# Patient Record
Sex: Female | Born: 1990 | Race: White | Hispanic: No | Smoking: Never smoker
Health system: Southern US, Community
[De-identification: ages and names within clinical notes are randomized; demographics above are authoritative.]

## PROBLEM LIST (undated history)

## (undated) DIAGNOSIS — Z9889 Other specified postprocedural states: Secondary | ICD-10-CM

## (undated) DIAGNOSIS — K219 Gastro-esophageal reflux disease without esophagitis: Secondary | ICD-10-CM

## (undated) DIAGNOSIS — R112 Nausea with vomiting, unspecified: Secondary | ICD-10-CM

## (undated) DIAGNOSIS — G43909 Migraine, unspecified, not intractable, without status migrainosus: Secondary | ICD-10-CM

## (undated) DIAGNOSIS — E119 Type 2 diabetes mellitus without complications: Secondary | ICD-10-CM

## (undated) DIAGNOSIS — O24419 Gestational diabetes mellitus in pregnancy, unspecified control: Secondary | ICD-10-CM

## (undated) HISTORY — PX: NO PAST SURGERIES: SHX2092

## (undated) HISTORY — PX: OTHER SURGICAL HISTORY: SHX169

## (undated) HISTORY — PX: LIPOSUCTION: SHX10

---
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
## 2010-03-04 NOTE — L&D Delivery Note (Signed)
Operative Delivery Note At 6:15 PM a viable female was delivered via .  Presentation: vertex; Position: Left,, Occiput,, Anterior; Station: +3.  Verbal consent: obtained from patient.  Risks and benefits discussed in detail.  Risks include, but are not limited to the risks of anesthesia, bleeding, infection, damage to maternal tissues, fetal cephalhematoma.  There is also the risk of inability to effect vaginal delivery of the head, or shoulder dystocia that cannot be resolved by established maneuvers, leading to the need for emergency cesarean section.  APGAR: , ; weight .   Placenta status: , .   Cord:  with the following complications: .  Cord pH: pending  Anesthesia: Epidural  Instruments: kiwi vac Episiotomy: none Lacerations: none Suture Repair: none Est. Blood Loss350 (mL):   Mom to postpartum.  Baby to nursery-stable.  Zerita Boers 12/02/2010, 6:25 PM

## 2010-04-11 LAB — RPR: RPR: NONREACTIVE

## 2010-04-11 LAB — ANTIBODY SCREEN: Antibody Screen: NEGATIVE

## 2010-04-11 LAB — HEPATITIS B SURFACE ANTIGEN: Hepatitis B Surface Ag: NEGATIVE

## 2010-06-11 LAB — URINE CULTURE: Colony Count: NO GROWTH

## 2010-06-11 LAB — URINALYSIS, ROUTINE W REFLEX MICROSCOPIC
Nitrite: NEGATIVE
Specific Gravity, Urine: 1.027 (ref 1.005–1.030)
pH: 6.5 (ref 5.0–8.0)

## 2010-06-11 LAB — URINE MICROSCOPIC-ADD ON

## 2010-06-11 LAB — PREGNANCY, URINE: Preg Test, Ur: NEGATIVE

## 2010-06-14 LAB — PREGNANCY, URINE: Preg Test, Ur: NEGATIVE

## 2010-06-14 LAB — URINALYSIS, ROUTINE W REFLEX MICROSCOPIC
Ketones, ur: NEGATIVE mg/dL
Nitrite: NEGATIVE
Specific Gravity, Urine: 1.016 (ref 1.005–1.030)
pH: 6.5 (ref 5.0–8.0)

## 2010-06-14 LAB — URINE MICROSCOPIC-ADD ON

## 2010-11-22 LAB — CBC
HCT: 28 — ABNORMAL LOW
Hemoglobin: 9.2 — ABNORMAL LOW
MCHC: 32.7
RDW: 17.1 — ABNORMAL HIGH

## 2010-12-02 ENCOUNTER — Inpatient Hospital Stay (HOSPITAL_COMMUNITY): Payer: Medicaid Other | Admitting: Anesthesiology

## 2010-12-02 ENCOUNTER — Encounter (HOSPITAL_COMMUNITY): Payer: Self-pay | Admitting: *Deleted

## 2010-12-02 ENCOUNTER — Inpatient Hospital Stay (HOSPITAL_COMMUNITY)
Admission: AD | Admit: 2010-12-02 | Discharge: 2010-12-04 | DRG: 775 | Disposition: A | Discharge status: Home / Self Care | Payer: Medicaid Other | Source: Ambulatory Visit | Attending: Obstetrics & Gynecology | Admitting: Obstetrics & Gynecology

## 2010-12-02 ENCOUNTER — Encounter (HOSPITAL_COMMUNITY): Payer: Self-pay | Admitting: Anesthesiology

## 2010-12-02 ENCOUNTER — Encounter (HOSPITAL_COMMUNITY): Payer: Self-pay

## 2010-12-02 DIAGNOSIS — Z22330 Carrier of Group B streptococcus: Secondary | ICD-10-CM

## 2010-12-02 DIAGNOSIS — O99892 Other specified diseases and conditions complicating childbirth: Secondary | ICD-10-CM | POA: Diagnosis present

## 2010-12-02 LAB — CBC
Platelets: 217 10*3/uL (ref 150–400)
RBC: 4.65 MIL/uL (ref 3.87–5.11)
WBC: 10.3 10*3/uL (ref 4.0–10.5)

## 2010-12-02 LAB — RPR: RPR Ser Ql: NONREACTIVE

## 2010-12-02 MED ORDER — ACETAMINOPHEN 325 MG PO TABS: 650.0000 mg | ORAL_TABLET | ORAL | Status: DC | PRN

## 2010-12-02 MED ORDER — SENNOSIDES-DOCUSATE SODIUM 8.6-50 MG PO TABS
2.0000 | ORAL_TABLET | Freq: Every day | ORAL | Status: DC
  Administered 2010-12-02 – 2010-12-03 (×2): 2 via ORAL

## 2010-12-02 MED ORDER — PENICILLIN G POTASSIUM 5000000 UNITS IJ SOLR: 5.0000 10*6.[IU] | Freq: Once | INTRAVENOUS | Status: DC

## 2010-12-02 MED ORDER — LACTATED RINGERS IV SOLN: INTRAVENOUS | Status: DC

## 2010-12-02 MED ORDER — OXYTOCIN BOLUS FROM INFUSION: 500.0000 mL | Freq: Once | INTRAVENOUS | Status: DC

## 2010-12-02 MED ORDER — CITRIC ACID-SODIUM CITRATE 334-500 MG/5ML PO SOLN: 30.0000 mL | ORAL | Status: DC | PRN

## 2010-12-02 MED ORDER — OXYTOCIN 20 UNITS IN LACTATED RINGERS INFUSION - SIMPLE: 125.0000 mL/h | Freq: Once | INTRAVENOUS | Status: DC

## 2010-12-02 MED ORDER — DEXTROSE 5 % IV SOLN: 2.5000 10*6.[IU] | INTRAVENOUS | Status: DC

## 2010-12-02 MED ORDER — OXYCODONE-ACETAMINOPHEN 5-325 MG PO TABS: 2.0000 | ORAL_TABLET | ORAL | Status: DC | PRN

## 2010-12-02 MED ORDER — LANOLIN HYDROUS EX OINT: TOPICAL_OINTMENT | CUTANEOUS | Status: DC | PRN

## 2010-12-02 MED ORDER — EPHEDRINE 5 MG/ML INJ
10.0000 mg | INTRAVENOUS | Status: DC | PRN
  Filled 2010-12-02: qty 4

## 2010-12-02 MED ORDER — OXYTOCIN BOLUS FROM INFUSION
500.0000 mL | Freq: Once | INTRAVENOUS | Status: DC
  Filled 2010-12-02: qty 500
  Filled 2010-12-02: qty 1000

## 2010-12-02 MED ORDER — LACTATED RINGERS IV SOLN: 500.0000 mL | INTRAVENOUS | Status: DC | PRN

## 2010-12-02 MED ORDER — IBUPROFEN 600 MG PO TABS: 600.0000 mg | ORAL_TABLET | Freq: Four times a day (QID) | ORAL | Status: DC | PRN

## 2010-12-02 MED ORDER — FENTANYL 2.5 MCG/ML BUPIVACAINE 1/10 % EPIDURAL INFUSION (WH - ANES)
INTRAMUSCULAR | Status: DC | PRN
  Administered 2010-12-02: 14 mL/h via EPIDURAL

## 2010-12-02 MED ORDER — DIBUCAINE 1 % RE OINT: 1.0000 "application " | TOPICAL_OINTMENT | RECTAL | Status: DC | PRN

## 2010-12-02 MED ORDER — IBUPROFEN 600 MG PO TABS
600.0000 mg | ORAL_TABLET | Freq: Four times a day (QID) | ORAL | Status: DC
  Administered 2010-12-02 – 2010-12-04 (×6): 600 mg via ORAL
  Filled 2010-12-02 (×7): qty 1

## 2010-12-02 MED ORDER — TETANUS-DIPHTH-ACELL PERTUSSIS 5-2.5-18.5 LF-MCG/0.5 IM SUSP: 0.5000 mL | Freq: Once | INTRAMUSCULAR | Status: DC

## 2010-12-02 MED ORDER — ONDANSETRON HCL 4 MG/2ML IJ SOLN: 4.0000 mg | Freq: Four times a day (QID) | INTRAMUSCULAR | Status: DC | PRN

## 2010-12-02 MED ORDER — BENZOCAINE-MENTHOL 20-0.5 % EX AERO
INHALATION_SPRAY | CUTANEOUS | Status: AC
  Administered 2010-12-02: 1 via TOPICAL
  Filled 2010-12-02: qty 56

## 2010-12-02 MED ORDER — EPHEDRINE 5 MG/ML INJ
10.0000 mg | INTRAVENOUS | Status: DC | PRN
  Filled 2010-12-02 (×2): qty 4

## 2010-12-02 MED ORDER — LACTATED RINGERS IV SOLN
INTRAVENOUS | Status: DC
  Administered 2010-12-02 (×2): via INTRAVENOUS

## 2010-12-02 MED ORDER — FENTANYL 2.5 MCG/ML BUPIVACAINE 1/10 % EPIDURAL INFUSION (WH - ANES)
14.0000 mL/h | INTRAMUSCULAR | Status: DC
  Administered 2010-12-02: 14 mL/h via EPIDURAL
  Filled 2010-12-02 (×2): qty 60

## 2010-12-02 MED ORDER — ONDANSETRON HCL 4 MG/2ML IJ SOLN: 4.0000 mg | INTRAMUSCULAR | Status: DC | PRN

## 2010-12-02 MED ORDER — LIDOCAINE HCL (PF) 1 % IJ SOLN: 30.0000 mL | INTRAMUSCULAR | Status: DC | PRN

## 2010-12-02 MED ORDER — PHENYLEPHRINE 40 MCG/ML (10ML) SYRINGE FOR IV PUSH (FOR BLOOD PRESSURE SUPPORT)
80.0000 ug | PREFILLED_SYRINGE | INTRAVENOUS | Status: DC | PRN
  Filled 2010-12-02 (×2): qty 5

## 2010-12-02 MED ORDER — SODIUM CHLORIDE 0.9 % IV SOLN
2.0000 g | Freq: Four times a day (QID) | INTRAVENOUS | Status: DC
  Administered 2010-12-02 (×2): 2 g via INTRAVENOUS
  Filled 2010-12-02 (×4): qty 2000

## 2010-12-02 MED ORDER — WITCH HAZEL-GLYCERIN EX PADS: 1.0000 "application " | MEDICATED_PAD | CUTANEOUS | Status: DC | PRN

## 2010-12-02 MED ORDER — PRENATAL PLUS 27-1 MG PO TABS
1.0000 | ORAL_TABLET | Freq: Every day | ORAL | Status: DC
  Administered 2010-12-03: 1 via ORAL
  Filled 2010-12-02: qty 1

## 2010-12-02 MED ORDER — LIDOCAINE HCL 1.5 % IJ SOLN
INTRAMUSCULAR | Status: DC | PRN
  Administered 2010-12-02 (×2): 5 mL via EPIDURAL

## 2010-12-02 MED ORDER — DIPHENHYDRAMINE HCL 25 MG PO CAPS: 25.0000 mg | ORAL_CAPSULE | Freq: Four times a day (QID) | ORAL | Status: DC | PRN

## 2010-12-02 MED ORDER — PHENYLEPHRINE 40 MCG/ML (10ML) SYRINGE FOR IV PUSH (FOR BLOOD PRESSURE SUPPORT)
80.0000 ug | PREFILLED_SYRINGE | INTRAVENOUS | Status: DC | PRN
  Filled 2010-12-02: qty 5

## 2010-12-02 MED ORDER — BENZOCAINE-MENTHOL 20-0.5 % EX AERO
1.0000 "application " | INHALATION_SPRAY | CUTANEOUS | Status: DC | PRN
  Administered 2010-12-02: 1 via TOPICAL

## 2010-12-02 MED ORDER — ONDANSETRON HCL 4 MG PO TABS: 4.0000 mg | ORAL_TABLET | ORAL | Status: DC | PRN

## 2010-12-02 MED ORDER — OXYCODONE-ACETAMINOPHEN 5-325 MG PO TABS: 1.0000 | ORAL_TABLET | ORAL | Status: DC | PRN

## 2010-12-02 MED ORDER — DIPHENHYDRAMINE HCL 50 MG/ML IJ SOLN: 12.5000 mg | INTRAMUSCULAR | Status: DC | PRN

## 2010-12-02 MED ORDER — LIDOCAINE HCL (PF) 1 % IJ SOLN
30.0000 mL | INTRAMUSCULAR | Status: DC | PRN
  Filled 2010-12-02: qty 30

## 2010-12-02 MED ORDER — FLEET ENEMA 7-19 GM/118ML RE ENEM: 1.0000 | ENEMA | RECTAL | Status: DC | PRN

## 2010-12-02 MED ORDER — LACTATED RINGERS IV SOLN: 500.0000 mL | Freq: Once | INTRAVENOUS | Status: DC

## 2010-12-02 MED ORDER — ZOLPIDEM TARTRATE 5 MG PO TABS: 5.0000 mg | ORAL_TABLET | Freq: Every evening | ORAL | Status: DC | PRN

## 2010-12-02 MED ORDER — SIMETHICONE 80 MG PO CHEW: 80.0000 mg | CHEWABLE_TABLET | ORAL | Status: DC | PRN

## 2010-12-02 NOTE — Anesthesia Postprocedure Evaluation (Signed)
Anesthesia Post Note  Patient: Vickie Lloyd  Procedure(s) Performed: * No procedures listed *  Anesthesia type: Epidural  Patient location: Mother/Baby  Post pain: Pain level controlled  Post assessment: Post-op Vital signs reviewed  Last Vitals:  Filed Vitals:   12/02/10 1846  BP: 127/76  Pulse: 102  Temp:   Resp: 20    Post vital signs: Reviewed  Level of consciousness: awake  Complications: No apparent anesthesia complications

## 2010-12-02 NOTE — H&P (Signed)
Kalianne Fetting is a 20 y.o. female presenting for active labor. Maternal Medical History:  Reason for admission: Reason for admission: contractions.  Contractions: Onset was 13-24 hours ago.   Frequency: regular.   Perceived severity is moderate.    Fetal activity: Perceived fetal activity is normal.   Last perceived fetal movement was within the past hour.      OB History    Grav Para Term Preterm Abortions TAB SAB Ect Mult Living   2 1 1  0 0 0 0 0 0 1     Past Medical History  Diagnosis Date  . No pertinent past medical history    Past Surgical History  Procedure Date  . No past surgeries    Family History: family history is not on file. Social History:  reports that she has never smoked. She does not have any smokeless tobacco history on file. She reports that she does not drink alcohol or use illicit drugs.  Review of Systems  Constitutional: Negative.   HENT: Negative.   Eyes: Negative.   Respiratory: Negative.   Cardiovascular: Negative.   Gastrointestinal: Negative.   Genitourinary: Negative.   Musculoskeletal: Negative.   Skin: Negative.   Neurological: Negative.   Endo/Heme/Allergies: Negative.   Psychiatric/Behavioral: Negative.     Dilation: 6.5 Effacement (%): 80 Station: -1 Exam by:: d. Tommye Lehenbauer,CNM Blood pressure 118/78, pulse 91, temperature 97.9 F (36.6 C), temperature source Oral, resp. rate 16, height 5\' 4"  (1.626 m), weight 65.772 kg (145 lb). Maternal Exam:  Uterine Assessment: Contraction strength is moderate.  Contraction frequency is regular.   Abdomen: Patient reports no abdominal tenderness. Fetal presentation: vertex  Introitus: Normal vulva. Normal vagina.    Physical Exam  Constitutional: She is oriented to person, place, and time. She appears well-developed and well-nourished.  HENT:  Head: Normocephalic.  Neck: Normal range of motion.  Cardiovascular: Normal rate, regular rhythm and normal heart sounds.   Respiratory: Effort  normal and breath sounds normal.  GI: Soft. Bowel sounds are normal.  Genitourinary: Vagina normal and uterus normal.  Musculoskeletal: Normal range of motion.  Neurological: She is alert and oriented to person, place, and time. She has normal reflexes.  Skin: Skin is warm and dry.  Psychiatric: She has a normal mood and affect. Her behavior is normal. Judgment and thought content normal.    Prenatal labs: ABO, Rh: A/Positive/-- (02/08 0000) Antibody: Negative (02/08 0000) Rubella: Immune (02/08 0000) RPR: Nonreactive (02/08 0000)  HBsAg: Negative (02/08 0000)  HIV:    GBS: Positive (09/14 0000)   Assessment/Plan: Admit antisipate vag delivery.   Zerita Boers 12/02/2010, 1:51 PM

## 2010-12-02 NOTE — Anesthesia Preprocedure Evaluation (Signed)
Anesthesia Evaluation  Name, MR# and DOB Patient awake  General Assessment Comment  Reviewed: Allergy & Precautions, H&P , Patient's Chart, lab work & pertinent test results  Airway Mallampati: I TM Distance: >3 FB Neck ROM: full    Dental No notable dental hx.    Pulmonary  clear to auscultation  Pulmonary exam normal       Cardiovascular     Neuro/Psych Negative Neurological ROS  Negative Psych ROS   GI/Hepatic negative GI ROS Neg liver ROS    Endo/Other  Negative Endocrine ROS  Renal/GU negative Renal ROS     Musculoskeletal negative musculoskeletal ROS (+)   Abdominal Normal abdominal exam  (+)   Peds  Hematology negative hematology ROS (+)   Anesthesia Other Findings   Reproductive/Obstetrics (+) Pregnancy                           Anesthesia Physical Anesthesia Plan  ASA: II  Anesthesia Plan: Epidural   Post-op Pain Management:    Induction:   Airway Management Planned:   Additional Equipment:   Intra-op Plan:   Post-operative Plan:   Informed Consent: I have reviewed the patients History and Physical, chart, labs and discussed the procedure including the risks, benefits and alternatives for the proposed anesthesia with the patient or authorized representative who has indicated his/her understanding and acceptance.     Plan Discussed with:   Anesthesia Plan Comments:         Anesthesia Quick Evaluation

## 2010-12-02 NOTE — Anesthesia Procedure Notes (Signed)
Epidural Patient location during procedure: OB Start time: 12/02/2010 2:16 PM End time: 12/02/2010 2:23 PM Reason for block: procedure for pain  Staffing Anesthesiologist: Sandrea Hughs Performed by: anesthesiologist   Preanesthetic Checklist Completed: patient identified, site marked, surgical consent, pre-op evaluation, timeout performed, IV checked, risks and benefits discussed and monitors and equipment checked  Epidural Patient position: sitting Prep: site prepped and draped and DuraPrep Patient monitoring: continuous pulse ox and blood pressure Approach: midline Injection technique: LOR air  Needle:  Needle type: Tuohy  Needle gauge: 17 G Needle length: 9 cm Needle insertion depth: 5 cm cm Catheter type: closed end flexible Catheter size: 19 Gauge Catheter at skin depth: 10 cm Test dose: negative and 1.5% lidocaine  Assessment Sensory level: T8 Events: blood not aspirated, injection not painful, no injection resistance, negative IV test and no paresthesia

## 2010-12-02 NOTE — Progress Notes (Signed)
Contractions since around 10:00 strong and every 10 minutes now 3 minutes, no vaginal bleeding, G2P1 40 weeks

## 2010-12-02 NOTE — Progress Notes (Signed)
Pt presents to MAU with complaints of contractions. Pt states contractions started at 10:00 am

## 2010-12-02 NOTE — H&P (Signed)
Agree with above note.  Daryle Amis H. 12/02/2010 2:53 PM

## 2010-12-03 NOTE — Addendum Note (Signed)
Addendum  created 12/03/10 0810 by Suella Grove   Modules edited:Charges VN, Notes Section

## 2010-12-03 NOTE — Progress Notes (Signed)
Post Partum Day 1 Subjective: no complaints, up ad lib, voiding and tolerating PO  Objective: Blood pressure 96/58, pulse 88, temperature 98.4 F (36.9 C), temperature source Oral, resp. rate 18, height 5\' 4"  (1.626 m), weight 145 lb (65.772 kg), SpO2 99.00%, unknown if currently breastfeeding.  Physical Exam:  General: alert, cooperative and no distress Lochia: appropriate Uterine Fundus: firm Incision: NA DVT Evaluation: No evidence of DVT seen on physical exam. Negative Homan's sign.   Basename 12/02/10 1315  HGB 10.2*  HCT 33.0*    Assessment/Plan: Plan for discharge tomorrow, Circumcision prior to discharge and Contraception Implanon   LOS: 1 day   Mat Carne 12/03/2010, 7:35 AM

## 2010-12-03 NOTE — Anesthesia Postprocedure Evaluation (Signed)
  Anesthesia Post-op Note  Patient: Vickie Lloyd  Procedure(s) Performed: * No procedures listed *  Patient Location: Mother/Baby  Anesthesia Type: Epidural  Level of Consciousness: awake, alert , oriented and patient cooperative  Airway and Oxygen Therapy: Patient Spontanous Breathing  Post-op Pain: none  Post-op Assessment: Post-op Vital signs reviewed, Patient's Cardiovascular Status Stable, Respiratory Function Stable, No signs of Nausea or vomiting, Adequate PO intake and Pain level controlled  Post-op Vital Signs: Reviewed and stable  Complications: No apparent anesthesia complications

## 2010-12-03 NOTE — Progress Notes (Signed)
UR chart review completed.  

## 2010-12-04 MED ORDER — IBUPROFEN 600 MG PO TABS: 600.0000 mg | ORAL_TABLET | Freq: Four times a day (QID) | ORAL | Status: AC

## 2010-12-04 NOTE — Discharge Summary (Signed)
Obstetric Discharge Summary Reason for Admission: onset of labor Prenatal Procedures:  None Intrapartum Procedures: spontaneous vaginal delivery Postpartum Procedures: none Complications-Operative and Postpartum: none Hemoglobin  Date Value Range Status  12/02/2010 10.2* 12.0-15.0 (g/dL) Final     HCT  Date Value Range Status  12/02/2010 33.0* 36.0-46.0 (%) Final    Discharge Diagnoses: Term Pregnancy-delivered  Discharge Information: Date: 12/04/2010 Activity: pelvic rest Diet: routine Medications: Ibuprofen Condition: stable Instructions: refer to practice specific booklet Discharge to: home Follow-up Information    Follow up with [facility]. Make an appointment in 6 weeks. (Post-partum visit)          Newborn Data: Live born female  Birth Weight: 7 lb 9 oz (3430 g) APGAR: 9, 9  Home with mother.  SMITH,JOSHUA 12/04/2010, 7:37 AM

## 2010-12-04 NOTE — Progress Notes (Signed)
Post Partum Day 2 Subjective: no complaints, voiding, tolerating PO, + flatus and no BM yet  Objective: Blood pressure 97/62, pulse 72, temperature 97.5 F (36.4 C), temperature source Oral, resp. rate 18, height 5\' 4"  (1.626 m), weight 65.772 kg (145 lb), SpO2 100.00%, unknown if currently breastfeeding.  Physical Exam:  General: alert, cooperative and no distress Lochia: appropriate Uterine Fundus: firm DVT Evaluation: No evidence of DVT seen on physical exam. Negative Homan's sign. No cords or calf tenderness. No significant calf/ankle edema.   Basename 12/02/10 1315  HGB 10.2*  HCT 33.0*    Assessment/Plan: Discharge home   LOS: 2 days   Mertis Mosher 12/04/2010, 6:44 AM

## 2010-12-05 NOTE — Discharge Summary (Signed)
Agree with above note.  Vickie Lloyd H. 12/05/2010 9:44 AM

## 2010-12-08 NOTE — Progress Notes (Signed)
I agree with the above. Cam Hai 8:32 PM 12/08/2010

## 2010-12-13 LAB — URINALYSIS, ROUTINE W REFLEX MICROSCOPIC
Glucose, UA: NEGATIVE
Protein, ur: NEGATIVE
Specific Gravity, Urine: 1.02
Urobilinogen, UA: 0.2

## 2010-12-17 LAB — URINALYSIS, ROUTINE W REFLEX MICROSCOPIC
Ketones, ur: NEGATIVE
Nitrite: NEGATIVE
Protein, ur: NEGATIVE
pH: 7

## 2012-05-07 ENCOUNTER — Encounter (HOSPITAL_BASED_OUTPATIENT_CLINIC_OR_DEPARTMENT_OTHER): Payer: Self-pay

## 2012-05-07 ENCOUNTER — Emergency Department (HOSPITAL_BASED_OUTPATIENT_CLINIC_OR_DEPARTMENT_OTHER): Payer: Self-pay

## 2012-05-07 ENCOUNTER — Emergency Department (HOSPITAL_BASED_OUTPATIENT_CLINIC_OR_DEPARTMENT_OTHER)
Admission: EM | Admit: 2012-05-07 | Discharge: 2012-05-07 | Disposition: A | Discharge status: Home / Self Care | Payer: Self-pay | Attending: Emergency Medicine | Admitting: Emergency Medicine

## 2012-05-07 DIAGNOSIS — Z87442 Personal history of urinary calculi: Secondary | ICD-10-CM | POA: Insufficient documentation

## 2012-05-07 DIAGNOSIS — B9789 Other viral agents as the cause of diseases classified elsewhere: Secondary | ICD-10-CM | POA: Insufficient documentation

## 2012-05-07 DIAGNOSIS — Z3202 Encounter for pregnancy test, result negative: Secondary | ICD-10-CM | POA: Insufficient documentation

## 2012-05-07 DIAGNOSIS — B349 Viral infection, unspecified: Secondary | ICD-10-CM

## 2012-05-07 DIAGNOSIS — M549 Dorsalgia, unspecified: Secondary | ICD-10-CM

## 2012-05-07 DIAGNOSIS — M545 Low back pain, unspecified: Secondary | ICD-10-CM | POA: Insufficient documentation

## 2012-05-07 LAB — URINALYSIS, ROUTINE W REFLEX MICROSCOPIC
Bilirubin Urine: NEGATIVE
Hgb urine dipstick: NEGATIVE
Protein, ur: NEGATIVE mg/dL
Urobilinogen, UA: 0.2 mg/dL (ref 0.0–1.0)

## 2012-05-07 MED ORDER — METHOCARBAMOL 500 MG PO TABS: 500.0000 mg | ORAL_TABLET | Freq: Two times a day (BID) | ORAL | Status: DC

## 2012-05-07 MED ORDER — IBUPROFEN 800 MG PO TABS: 800.0000 mg | ORAL_TABLET | Freq: Three times a day (TID) | ORAL | Status: DC

## 2012-05-07 NOTE — ED Provider Notes (Signed)
History     CSN: [id number]  Arrival date & time 05/07/12  1501   First MD Initiated Contact with Patient 05/07/12 1525      Chief Complaint  Patient presents with  . Sore Throat  . Back Pain    (Consider location/radiation/quality/duration/timing/severity/associated sxs/prior treatment) Patient is a 22 y.o. female presenting with pharyngitis and back pain. The history is provided by the patient. No language interpreter was used.  Sore Throat This is a new problem. The current episode started today. The problem occurs constantly. The problem has been unchanged. Associated symptoms include a sore throat. Nothing aggravates the symptoms. The treatment provided moderate relief.  Back Pain Pt complains of low back pain.   Pt reports she has similar pain to when she had a kidney stone  Past Medical History  Diagnosis Date  . No pertinent past medical history     Past Surgical History  Procedure Laterality Date  . No past surgeries      No family history on file.  History  Substance Use Topics  . Smoking status: Never Smoker   . Smokeless tobacco: Not on file  . Alcohol Use: No    OB History   Grav Para Term Preterm Abortions TAB SAB Ect Mult Living   2 2 2  0 0 0 0 0 0 2      Review of Systems  HENT: Positive for sore throat.   Musculoskeletal: Positive for back pain.  All other systems reviewed and are negative.    Allergies  Review of patient's allergies indicates no known allergies.  Home Medications  No current outpatient prescriptions on file.  BP 103/65  Pulse 65  Temp(Src) 97.1 F (36.2 C)  Resp 16  Ht 5\' 3"  (1.6 m)  Wt 135 lb (61.236 kg)  BMI 23.92 kg/m2  SpO2 100%  LMP 04/23/2012  Physical Exam  Nursing note and vitals reviewed. Constitutional: She is oriented to person, place, and time. She appears well-developed and well-nourished.  HENT:  Head: Normocephalic.  Right Ear: External ear normal.  Left Ear: External ear normal.   Mouth/Throat: No oropharyngeal exudate.  Erythema throat  Eyes: Conjunctivae and EOM are normal. Pupils are equal, round, and reactive to light.  Neck: Normal range of motion. Neck supple.  Pulmonary/Chest: Effort normal.  Abdominal: Soft.  Musculoskeletal: Normal range of motion.  Neurological: She is alert and oriented to person, place, and time. She has normal reflexes.  Skin: Skin is warm.  Psychiatric: She has a normal mood and affect.    ED Course  Procedures (including critical care time)  Labs Reviewed  RAPID STREP SCREEN  URINALYSIS, ROUTINE W REFLEX MICROSCOPIC  PREGNANCY, URINE   Ct Abdomen Pelvis Wo Contrast  05/07/2012  *RADIOLOGY REPORT*  Clinical Data: Left flank pain and hematuria.  CT ABDOMEN AND PELVIS WITHOUT CONTRAST  Technique:  Multidetector CT imaging of the abdomen and pelvis was performed following the standard protocol without intravenous contrast.  Comparison: No priors.  Findings:  Lung Bases: Unremarkable.  Abdomen/Pelvis:  There are no abnormal calcifications within the collecting system of either kidney, along the course of either ureter, or within the lumen of the urinary bladder.  No hydroureteronephrosis or perinephric stranding to suggest urinary tract obstruction at this time.  The unenhanced appearance of the liver, gallbladder, pancreas, spleen and bilateral adrenal glands is unremarkable.  The uterus and ovaries are unremarkable in appearance on this noncontrast CT examination.  No significant volume of ascites.  No pneumoperitoneum.  No pathologic distension of small bowel.  No definite pathologic lymphadenopathy identified within the abdomen or pelvis on today's noncontrast CT examination.  The urinary bladder is unremarkable in appearance.  The appendix is normal.  Musculoskeletal: There are no aggressive appearing lytic or blastic lesions noted in the visualized portions of the skeleton.  IMPRESSION: 1.  No acute findings in the abdomen or pelvis to  account for the patient's symptoms.  Specifically, no abnormal urinary tract calculi. 2.  Normal appendix.   Original Report Authenticated By: Trudie Reed, M.D.      No diagnosis found.    MDM  No stone,    Strep negative.   Pt given rx for ibuprofen for discomfort and robaxin.           Lonia Skinner [place], PA-C 05/07/12 1753

## 2012-05-07 NOTE — ED Notes (Signed)
D/c home- advised of prescriptions available in pharmacy

## 2012-05-07 NOTE — ED Notes (Signed)
C/o sore throat x 3 days and back pain x 2 days

## 2012-05-07 NOTE — ED Provider Notes (Signed)
Medical screening examination/treatment/procedure(s) were performed by non-physician practitioner and as supervising physician I was immediately available for consultation/collaboration.  Gilda Crease, MD 05/07/12 1949

## 2014-01-03 ENCOUNTER — Encounter (HOSPITAL_BASED_OUTPATIENT_CLINIC_OR_DEPARTMENT_OTHER): Payer: Self-pay

## 2014-01-26 ENCOUNTER — Encounter: Payer: Medicaid Other | Admitting: Women's Health

## 2014-02-08 ENCOUNTER — Ambulatory Visit (INDEPENDENT_AMBULATORY_CARE_PROVIDER_SITE_OTHER): Payer: Medicaid Other | Admitting: Women's Health

## 2014-02-08 ENCOUNTER — Encounter: Payer: Self-pay | Admitting: Women's Health

## 2014-02-08 VITALS — BP 102/62 | Ht 64.0 in | Wt 140.0 lb

## 2014-02-08 DIAGNOSIS — Z3049 Encounter for surveillance of other contraceptives: Secondary | ICD-10-CM

## 2014-02-08 DIAGNOSIS — Z3202 Encounter for pregnancy test, result negative: Secondary | ICD-10-CM

## 2014-02-08 DIAGNOSIS — Z30017 Encounter for initial prescription of implantable subdermal contraceptive: Secondary | ICD-10-CM

## 2014-02-08 DIAGNOSIS — Z975 Presence of (intrauterine) contraceptive device: Secondary | ICD-10-CM

## 2014-02-08 DIAGNOSIS — Z3046 Encounter for surveillance of implantable subdermal contraceptive: Secondary | ICD-10-CM

## 2014-02-08 LAB — POCT URINE PREGNANCY: PREG TEST UR: NEGATIVE

## 2014-02-08 NOTE — Progress Notes (Signed)
Patient ID: Vickie Lloyd, female   DOB: 1990/10/04, 23 y.o.   MRN: [id number] Vickie Lloyd is a 23 y.o. year old [id number] Caucasian female here for Nexplanon removal and reinsertion.  She was given informed consent for removal and reinsertion of her Nexplanon. Her Nexplanon was placed Oct 2012, No LMP recorded. Patient has had an implant., and her pregnancy test today was neg.   Risks/benefits/side effects of Nexplanon have been discussed and her questions have been answered.  Specifically, a failure rate of 03/998 has been reported, with an increased failure rate if pt takes St. John's Wort and/or antiseizure medicaitons.  Vickie Lloyd is aware of the common side effect of irregular bleeding, which the incidence of decreases over time.  BP 102/62 mmHg  Ht 5\' 4"  (1.626 m)  Wt 140 lb (63.504 kg)  BMI 24.02 kg/m2 No LMP recorded. Patient has had an implant. Results for orders placed or performed in visit on 02/08/14 (from the past 24 hour(s))  POCT urine pregnancy   Collection Time: 02/08/14  3:58 PM  Result Value Ref Range   Preg Test, Ur Negative      Appropriate time out taken. Nexplanon site identified.  Area prepped in usual sterile fashon. Two cc's of 2% lidocaine was used to anesthetize the area. A small stab incision was made right beside the implant on the distal portion.  The Nexplanon rod was grasped using hemostats and removed intact without difficulty.  The area was cleansed again with betadine and the Nexplanon was inserted per manufacturer's recommendations without difficulty.  Steri-strips and a pressure bandage was applied.  There was less than 3 cc blood loss. There were no complications.  The patient tolerated the procedure well.  She was instructed to keep the area clean and dry, remove pressure bandage in 24 hours, and keep insertion site covered with the steri-strips for 3-5 days.  She was given a card indicating date Nexplanon was inserted and date it needs to be removed.    Follow-up PRN problems.  Marge DuncansBooker, Nailyn Dearinger Randall CNM, [facility] ServicesWHNP-BC 02/08/2014 4:21 PM

## 2014-02-08 NOTE — Patient Instructions (Signed)
Keep the area clean and dry.  You can remove the big bandage in 24 hours, and the small steri-strip bandage in 3-5 days.  A back up method, such as condoms, should be used for two weeks. You may have irregular vaginal bleeding for the first 6 months after the Nexplanon is placed, then the bleeding usually lightens and it is possible that you may not have any periods.  If you have any concerns, please give us a call.    Etonogestrel implant What is this medicine? ETONOGESTREL (et oh noe JES trel) is a contraceptive (birth control) device. It is used to prevent pregnancy. It can be used for up to 3 years. This medicine may be used for other purposes; ask your health care provider or pharmacist if you have questions. COMMON BRAND NAME(S): Implanon, Nexplanon What should I tell my health care provider before I take this medicine? They need to know if you have any of these conditions: -abnormal vaginal bleeding -blood vessel disease or blood clots -cancer of the breast, cervix, or liver -depression -diabetes -gallbladder disease -headaches -heart disease or recent heart attack -high blood pressure -high cholesterol -kidney disease -liver disease -renal disease -seizures -tobacco smoker -an unusual or allergic reaction to etonogestrel, other hormones, anesthetics or antiseptics, medicines, foods, dyes, or preservatives -pregnant or trying to get pregnant -breast-feeding How should I use this medicine? This device is inserted just under the skin on the inner side of your upper arm by a health care professional. Talk to your pediatrician regarding the use of this medicine in children. Special care may be needed. Overdosage: If you think you've taken too much of this medicine contact a poison control center or emergency room at once. Overdosage: If you think you have taken too much of this medicine contact a poison control center or emergency room at once. NOTE: This medicine is only for you.  Do not share this medicine with others. What if I miss a dose? This does not apply. What may interact with this medicine? Do not take this medicine with any of the following medications: -amprenavir -bosentan -fosamprenavir This medicine may also interact with the following medications: -barbiturate medicines for inducing sleep or treating seizures -certain medicines for fungal infections like ketoconazole and itraconazole -griseofulvin -medicines to treat seizures like carbamazepine, felbamate, oxcarbazepine, phenytoin, topiramate -modafinil -phenylbutazone -rifampin -some medicines to treat HIV infection like atazanavir, indinavir, lopinavir, nelfinavir, tipranavir, ritonavir -St. John's wort This list may not describe all possible interactions. Give your health care provider a list of all the medicines, herbs, non-prescription drugs, or dietary supplements you use. Also tell them if you smoke, drink alcohol, or use illegal drugs. Some items may interact with your medicine. What should I watch for while using this medicine? This product does not protect you against HIV infection (AIDS) or other sexually transmitted diseases. You should be able to feel the implant by pressing your fingertips over the skin where it was inserted. Tell your doctor if you cannot feel the implant. What side effects may I notice from receiving this medicine? Side effects that you should report to your doctor or health care professional as soon as possible: -allergic reactions like skin rash, itching or hives, swelling of the face, lips, or tongue -breast lumps -changes in vision -confusion, trouble speaking or understanding -dark urine -depressed mood -general ill feeling or flu-like symptoms -light-colored stools -loss of appetite, nausea -right upper belly pain -severe headaches -severe pain, swelling, or tenderness in the abdomen -shortness of   breath, chest pain, swelling in a leg -signs of  pregnancy -sudden numbness or weakness of the face, arm or leg -trouble walking, dizziness, loss of balance or coordination -unusual vaginal bleeding, discharge -unusually weak or tired -yellowing of the eyes or skin Side effects that usually do not require medical attention (Report these to your doctor or health care professional if they continue or are bothersome.): -acne -breast pain -changes in weight -cough -fever or chills -headache -irregular menstrual bleeding -itching, burning, and vaginal discharge -pain or difficulty passing urine -sore throat This list may not describe all possible side effects. Call your doctor for medical advice about side effects. You may report side effects to FDA at 1-800-FDA-1088. Where should I keep my medicine? This drug is given in a hospital or clinic and will not be stored at home. NOTE: This sheet is a summary. It may not cover all possible information. If you have questions about this medicine, talk to your doctor, pharmacist, or health care provider.  2015, [employer]. (2011-08-26 15:37:45)  

## 2014-12-26 ENCOUNTER — Emergency Department (HOSPITAL_COMMUNITY): Payer: Medicaid Other

## 2014-12-26 ENCOUNTER — Encounter (HOSPITAL_COMMUNITY): Payer: Self-pay | Admitting: *Deleted

## 2014-12-26 ENCOUNTER — Emergency Department (HOSPITAL_COMMUNITY)
Admission: EM | Admit: 2014-12-26 | Discharge: 2014-12-27 | Disposition: A | Discharge status: Home / Self Care | Payer: Medicaid Other | Attending: Emergency Medicine | Admitting: Emergency Medicine

## 2014-12-26 DIAGNOSIS — G43109 Migraine with aura, not intractable, without status migrainosus: Secondary | ICD-10-CM | POA: Diagnosis not present

## 2014-12-26 DIAGNOSIS — G43909 Migraine, unspecified, not intractable, without status migrainosus: Secondary | ICD-10-CM | POA: Diagnosis present

## 2014-12-26 HISTORY — DX: Migraine, unspecified, not intractable, without status migrainosus: G43.909

## 2014-12-26 MED ORDER — PROCHLORPERAZINE EDISYLATE 5 MG/ML IJ SOLN
10.0000 mg | Freq: Once | INTRAMUSCULAR | Status: AC
  Administered 2014-12-26: 10 mg via INTRAVENOUS
  Filled 2014-12-26: qty 2

## 2014-12-26 MED ORDER — KETOROLAC TROMETHAMINE 30 MG/ML IJ SOLN
30.0000 mg | Freq: Once | INTRAMUSCULAR | Status: AC
  Administered 2014-12-26: 30 mg via INTRAVENOUS
  Filled 2014-12-26: qty 1

## 2014-12-26 MED ORDER — SODIUM CHLORIDE 0.9 % IV BOLUS (SEPSIS)
1000.0000 mL | Freq: Once | INTRAVENOUS | Status: AC
  Administered 2014-12-26: 1000 mL via INTRAVENOUS

## 2014-12-26 MED ORDER — DIPHENHYDRAMINE HCL 50 MG/ML IJ SOLN
25.0000 mg | Freq: Once | INTRAMUSCULAR | Status: AC
  Administered 2014-12-26: 25 mg via INTRAVENOUS
  Filled 2014-12-26: qty 1

## 2014-12-26 NOTE — ED Notes (Signed)
Pt c/o migraine x 3 days; pt states she used her imitrex yesterday and today with no relief; pt states last night at work she felt dizzy and was seeing "spots" in her vision and states her vision was a little blurry

## 2014-12-27 MED ORDER — DEXAMETHASONE SODIUM PHOSPHATE 10 MG/ML IJ SOLN
10.0000 mg | Freq: Once | INTRAMUSCULAR | Status: AC
  Administered 2014-12-27: 10 mg via INTRAVENOUS
  Filled 2014-12-27: qty 1

## 2014-12-27 NOTE — ED Provider Notes (Signed)
CSN: [id number]     Arrival date & time 12/26/14  2130 History   First MD Initiated Contact with Patient 12/26/14 2158     Chief Complaint  Patient presents with  . Migraine     (Consider location/radiation/quality/duration/timing/severity/associated sxs/prior Treatment) Patient is a 24 y.o. female presenting with migraines. The history is provided by the patient and the spouse.  Migraine This is a recurrent problem. Episode onset: 3 days. The problem occurs constantly. The problem has been unchanged. Associated symptoms include headaches, nausea, a visual change and vomiting. Pertinent negatives include no abdominal pain, anorexia, arthralgias, chest pain, congestion, fever, joint swelling, neck pain, numbness, rash, sore throat or weakness. Associated symptoms comments: Reports bright flashing lights in left eye field of vision which started today and is a new symptom for her typical migraine headaches.  She reports the frontal location and the gradual onset of headache is normal, except not relieved with the imitrex x 2, migraines usually resolve with just one dose.  Endorses however, took first dose ytd, only took excedrin the first day of headache.. Nothing aggravates the symptoms. She has tried rest and NSAIDs (imitrex) for the symptoms. The treatment provided no relief.    Past Medical History  Diagnosis Date  . Migraine    Past Surgical History  Procedure Laterality Date  . No past surgeries    . Birthmark removed as a child     History reviewed. No pertinent family history. Social History  Substance Use Topics  . Smoking status: Never Smoker   . Smokeless tobacco: None  . Alcohol Use: No   OB History    Gravida Para Term Preterm AB TAB SAB Ectopic Multiple Living   0 0 0 0 0 0 2     Review of Systems  Constitutional: Negative for fever.  HENT: Negative for congestion and sore throat.   Eyes: Positive for photophobia and visual disturbance.  Respiratory:  Negative for chest tightness and shortness of breath.   Cardiovascular: Negative for chest pain.  Gastrointestinal: Positive for nausea and vomiting. Negative for abdominal pain and anorexia.  Genitourinary: Negative.   Musculoskeletal: Negative for joint swelling, arthralgias and neck pain.  Skin: Negative.  Negative for rash and wound.  Neurological: Positive for headaches. Negative for dizziness, speech difficulty, weakness, light-headedness and numbness.  Psychiatric/Behavioral: Negative.       Allergies  Review of patient's allergies indicates no known allergies.  Home Medications   Prior to Admission medications   Medication Sig Start Date End Date Taking? Authorizing Provider  SUMAtriptan Succinate (IMITREX PO) Take 1 tablet by mouth once as needed (for migraine pain).   Yes Historical Provider, MD   BP 117/63 mmHg  Pulse 78  Temp(Src) 97.6 F (36.4 C) (Oral)  Resp 16  Ht  (1.575 m)  Wt 140 lb (63.504 kg)  BMI 25.60 kg/m2  SpO2 100%  LMP 12/19/2014 Physical Exam  Constitutional: She is oriented to person, place, and time. She appears well-developed and well-nourished.  Uncomfortable appearing  HENT:  Head: Normocephalic and atraumatic.  Mouth/Throat: Oropharynx is clear and moist.  Eyes: EOM are normal. Pupils are equal, round, and reactive to light.  Neck: Normal range of motion. Neck supple.  Cardiovascular: Normal rate and normal heart sounds.   Pulmonary/Chest: Effort normal.  Abdominal: Soft. There is no tenderness.  Musculoskeletal: Normal range of motion.  Lymphadenopathy:    She has no cervical adenopathy.  Neurological: She is alert and oriented to  person, place, and time. She has normal strength. No sensory deficit. Gait normal. GCS eye subscore is 4. GCS verbal subscore is 5. GCS motor subscore is 6.  Normal heel-shin, normal rapid alternating movements. Cranial nerves III-XII intact.  No pronator drift.  Skin: Skin is warm and dry. No rash noted.   Psychiatric: She has a normal mood and affect. Her speech is normal and behavior is normal. Thought content normal. Cognition and memory are normal.  Nursing note and vitals reviewed.   ED Course  Procedures (including critical care time) Labs Review Labs Reviewed  POC URINE PREG, ED    Imaging Review Ct Head Wo Contrast  12/26/2014  CLINICAL DATA:  Acute onset of migraine headache for 3 days. Dizziness and blurred vision. Initial encounter. EXAM: CT HEAD WITHOUT CONTRAST TECHNIQUE: Contiguous axial images were obtained from the base of the skull through the vertex without intravenous contrast. COMPARISON:  None. FINDINGS: There is no evidence of acute infarction, mass lesion, or intra- or extra-axial hemorrhage on CT. The posterior fossa, including the cerebellum, brainstem and fourth ventricle, is within normal limits. The third and lateral ventricles, and basal ganglia are unremarkable in appearance. The cerebral hemispheres are symmetric in appearance, with normal gray-white differentiation. No mass effect or midline shift is seen. There is no evidence of fracture; visualized osseous structures are unremarkable in appearance. The visualized portions of the orbits are within normal limits. The paranasal sinuses and mastoid air cells are well-aerated. No significant soft tissue abnormalities are seen. IMPRESSION: Unremarkable noncontrast CT of the head. Electronically Signed   By: Roanna RaiderJeffery  Chang M.D.   On: 12/26/2014 23:33   I have personally reviewed and evaluated these images and lab results as part of my medical decision-making.   EKG Interpretation None      MDM   Final diagnoses:  Migraine with aura and without status migrainosus, not intractable    Medications  dexamethasone (DECADRON) injection 10 mg (not administered)  sodium chloride 0.9 % bolus 1,000 mL (1,000 mLs Intravenous New Bag/Given 12/26/14 2307)  prochlorperazine (COMPAZINE) injection 10 mg (10 mg Intravenous  Given 12/26/14 2307)  diphenhydrAMINE (BENADRYL) injection 25 mg (25 mg Intravenous Given 12/26/14 2308)  ketorolac (TORADOL) 30 MG/ML injection 30 mg (30 mg Intravenous Given 12/26/14 2307)   Pt given toradol, benadryl and compazine IV along with IV fluids.  Pt headache 3/10 from 9/10 at first arrival.  She was sleeping without discomfort after Ct scan. Given decadron 10 mg IV prior to dc home.  Advised f/u with her pcp for further management if sx persist when she wakes.    Pt with h/o migraines, new aura. Non focal neuro exam.  PRN f/u with pcp recommended for persistent or worsened sx.   Burgess AmorJulie Jamal Haskin, PA-C 12/27/14 [id number]  Zadie Rhineonald Wickline, MD 12/27/14 [phone]

## 2014-12-27 NOTE — ED Notes (Signed)
Patient verbalizes understanding of discharge instruction, home care and follow up care if needed. Patient ambulatory out of department at this time with family member.

## 2014-12-27 NOTE — Discharge Instructions (Signed)

## 2016-01-19 ENCOUNTER — Ambulatory Visit (INDEPENDENT_AMBULATORY_CARE_PROVIDER_SITE_OTHER): Payer: BLUE CROSS/BLUE SHIELD | Admitting: Physician Assistant

## 2016-01-19 ENCOUNTER — Encounter: Payer: Self-pay | Admitting: Physician Assistant

## 2016-01-19 VITALS — BP 105/73 | HR 72 | Temp 97.2°F | Ht 62.0 in | Wt 140.6 lb

## 2016-01-19 DIAGNOSIS — S46812A Strain of other muscles, fascia and tendons at shoulder and upper arm level, left arm, initial encounter: Secondary | ICD-10-CM

## 2016-01-19 MED ORDER — CYCLOBENZAPRINE HCL 10 MG PO TABS: 10.0000 mg | ORAL_TABLET | Freq: Three times a day (TID) | ORAL | 0 refills | Status: DC | PRN

## 2016-01-19 MED ORDER — PREDNISONE 10 MG (48) PO TBPK: ORAL_TABLET | Freq: Every day | ORAL | 0 refills | Status: DC

## 2016-01-21 NOTE — Progress Notes (Signed)
BP 105/73   Pulse 72   Temp 97.2 F (36.2 C) (Oral)   Ht 5\' 2"  (1.575 m)   Wt 140 lb 9.6 oz (63.8 kg)   BMI 25.72 kg/m    Subjective:    Patient ID: Vickie DienesNikki Burges, female    DOB: October 26, 1990, 25 y.o.   MRN: [id number]  HPI: Vickie Dienesikki Lloyd is a 25 y.o. female presenting on 01/19/2016 for Shoulder Pain (left shoulder blade pain ) The patient has been working at a factory for many months now. She is predominantly using her left arm and hand. The patient is left-handed. She states that even after she is off for a couple days it does not make a big difference and she continues to have pain in the area between the scapula and her spine. She denies any numbness weakness loss of strength in her upper extremity. She has tried some over-the-counter medication but not for long-term.   Relevant past medical, surgical, family and social history reviewed and updated as indicated. Allergies and medications reviewed and updated.  Past Medical History:  Diagnosis Date  . Migraine     Past Surgical History:  Procedure Laterality Date  . birthmark removed as a child    . NO PAST SURGERIES      Review of Systems  Constitutional: Negative.   HENT: Negative.   Eyes: Negative.   Respiratory: Negative.   Gastrointestinal: Negative.   Genitourinary: Negative.   Musculoskeletal: Positive for back pain and myalgias.      Medication List       Accurate as of 01/19/16 11:59 PM. Always use your most recent med list.          cyclobenzaprine 10 MG tablet Commonly known as:  FLEXERIL Take 1 tablet (10 mg total) by mouth 3 (three) times daily as needed for muscle spasms.   predniSONE 10 MG (48) Tbpk tablet Commonly known as:  STERAPRED UNI-PAK 48 TAB Take by mouth daily.          Objective:    BP 105/73   Pulse 72   Temp 97.2 F (36.2 C) (Oral)   Ht 5\' 2"  (1.575 m)   Wt 140 lb 9.6 oz (63.8 kg)   BMI 25.72 kg/m   No Known Allergies  Physical Exam  Constitutional: She is  oriented to person, place, and time. She appears well-developed and well-nourished.  HENT:  Head: Normocephalic and atraumatic.  Eyes: Conjunctivae and EOM are normal. Pupils are equal, round, and reactive to light.  Cardiovascular: Normal rate, regular rhythm, normal heart sounds and intact distal pulses.   Pulmonary/Chest: Effort normal and breath sounds normal.  Abdominal: Soft. Bowel sounds are normal.  Musculoskeletal:       Thoracic back: She exhibits tenderness, pain and spasm. She exhibits normal range of motion, no bony tenderness, no swelling, no edema and no deformity.       Back:  Neurological: She is alert and oriented to person, place, and time. She has normal reflexes.  Skin: Skin is warm and dry. No rash noted.  Psychiatric: She has a normal mood and affect. Her behavior is normal. Judgment and thought content normal.       Assessment & Plan:   1. Strain of left trapezius muscle, initial encounter - predniSONE (STERAPRED UNI-PAK 48 TAB) 10 MG (48) TBPK tablet; Take by mouth daily.  Dispense: 48 tablet; Refill: 0 - cyclobenzaprine (FLEXERIL) 10 MG tablet; Take 1 tablet (10 mg total) by mouth 3 (three) times  daily as needed for muscle spasms.  Dispense: 40 tablet; Refill: 0   Continue all other maintenance medications as listed above.  Follow up plan: Return if symptoms worsen or fail to improve.  Educational handout given for Specific information from [employer] PATIENT ADVISOR trapezius/thoracic strain given to patient.   Remus LofflerAngel S. Eutha Cude PA-C [facility] [address]  [place], [place] [id number] [phone]   01/21/2016, 7:58 PM

## 2016-11-07 ENCOUNTER — Ambulatory Visit: Payer: BLUE CROSS/BLUE SHIELD | Admitting: Obstetrics & Gynecology

## 2016-11-07 ENCOUNTER — Encounter: Payer: Self-pay | Admitting: Obstetrics & Gynecology

## 2016-11-07 ENCOUNTER — Ambulatory Visit (INDEPENDENT_AMBULATORY_CARE_PROVIDER_SITE_OTHER): Payer: Self-pay | Admitting: Obstetrics & Gynecology

## 2016-11-07 ENCOUNTER — Encounter (INDEPENDENT_AMBULATORY_CARE_PROVIDER_SITE_OTHER): Payer: Self-pay

## 2016-11-07 VITALS — BP 110/70 | HR 72 | Wt 133.6 lb

## 2016-11-07 DIAGNOSIS — N939 Abnormal uterine and vaginal bleeding, unspecified: Secondary | ICD-10-CM

## 2016-11-07 DIAGNOSIS — N946 Dysmenorrhea, unspecified: Secondary | ICD-10-CM

## 2016-11-07 MED ORDER — KETOROLAC TROMETHAMINE 10 MG PO TABS: 10.0000 mg | ORAL_TABLET | Freq: Three times a day (TID) | ORAL | 0 refills | Status: DC | PRN

## 2016-11-07 MED ORDER — MEGESTROL ACETATE 40 MG PO TABS: ORAL_TABLET | ORAL | 3 refills | Status: DC

## 2016-11-07 NOTE — Progress Notes (Signed)
Chief Complaint  Patient presents with  . Menorrhagia    bleeding for 3 weeks monthly for several months with severe abdominal cramping    Blood pressure 110/70, pulse 72, weight 133 lb 9.6 oz (60.6 kg), last menstrual period 10/30/2016.  26 y.o. [id number] Patient's last menstrual period was 10/30/2016. The current method of family planning is Nexplanon which has been in for a little over 2 1/2 years.  Outpatient Encounter Prescriptions as of 11/07/2016  Medication Sig  . etonogestrel (NEXPLANON) 68 MG IMPL implant 1 each by Subdermal route once.  . cyclobenzaprine (FLEXERIL) 10 MG tablet Take 1 tablet (10 mg total) by mouth 3 (three) times daily as needed for muscle spasms. (Patient not taking: Reported on 11/07/2016)  . ketorolac (TORADOL) 10 MG tablet Take 1 tablet (10 mg total) by mouth every 8 (eight) hours as needed.  . megestrol (MEGACE) 40 MG tablet 3 tablets a day for 5 days, 2 tablets a day for 5 days then 1 tablet daily  . [DISCONTINUED] predniSONE (STERAPRED UNI-PAK 48 TAB) 10 MG (48) TBPK tablet Take by mouth daily. (Patient not taking: Reported on 11/07/2016)   No facility-administered encounter medications on file as of 11/07/2016.     Subjective Vickie Lloyd is in today because of increasing problems with bleeding and pain associated with her bleeding She has a And that is due to come out in December and she's had 2 previous Nexplanon's and she didn't have any trouble with any of until about 6 months ago when she began bleeding heavily approximately 3 weeks a monthand the time which is heavy bright red with clotting The pain associated also severe where she can't really function and worse when she is working up around and moving The pain is both in the front in her low back No pain with intercourse Generally speaking her pain is associated with the bleeding  Objective General WDWN female NAD Vulva:  normal appearing vulva with no masses, tenderness or lesions Vagina:  Vagina  is pink and moist without discharge she does have blood in the vault Cervix:  No cervical lesions are noted she is due for Pap smear couple weeks And she has no tenderness with motion of the cervix by itself Uterus:  Uterus is normal size shape contour somewhat tender to palpation and is significantly retroverted Adnexa: ovaries: Normal size no masses and nontender,     Pertinent ROS No burning with urination, frequency or urgency No nausea, vomiting or diarrhea Nor fever chills or other constitutional symptoms   Labs or studies No new    Impression Diagnoses this Encounter::   ICD-10-CM   1. Abnormal uterine bleeding (AUB) N93.9   2. Dysmenorrhea N94.6     Established relevant diagnosis(es):   Plan/Recommendations: Meds ordered this encounter  Medications  . etonogestrel (NEXPLANON) 68 MG IMPL implant    Sig: 1 each by Subdermal route once.  . megestrol (MEGACE) 40 MG tablet    Sig: 3 tablets a day for 5 days, 2 tablets a day for 5 days then 1 tablet daily    Dispense:  45 tablet    Refill:  3  . ketorolac (TORADOL) 10 MG tablet    Sig: Take 1 tablet (10 mg total) by mouth every 8 (eight) hours as needed.    Dispense:  15 tablet    Refill:  0    Labs or Scans Ordered: No orders of the defined types were placed in this encounter.  Management:: I'm sending Vickie Lloyd a prescription in for megestrol high-dose algorithm she'll take 3 a day for 5 days 2 a day for 5 days then one a day Also giving sending and a prescription for Toradol for cramping and encourage her to use a heating pad if she has heavy vaginal bleeding associated with cramping  Follow up Return for keep scheduled appointment, with Dr Despina Hidden.           All questions were answered.  Past Medical History:  Diagnosis Date  . Migraine     Past Surgical History:  Procedure Laterality Date  . birthmark removed as a child    . NO PAST SURGERIES      OB History    Gravida Para Term Preterm AB  Living   0 0 2   SAB TAB Ectopic Multiple Live Births   0 0 0 0 1      No Known Allergies  Social History   Social History  . Marital status: Divorced    Spouse name: N/A  . Number of children: N/A  . Years of education: N/A   Social History Main Topics  . Smoking status: Never Smoker  . Smokeless tobacco: Never Used  . Alcohol use No  . Drug use: No  . Sexual activity: Yes    Birth control/ protection: Implant   Other Topics Concern  . None   Social History Narrative  . None    No family history on file.

## 2016-11-18 ENCOUNTER — Other Ambulatory Visit (HOSPITAL_COMMUNITY)
Admission: RE | Admit: 2016-11-18 | Discharge: 2016-11-18 | Disposition: A | Discharge status: Home / Self Care | Payer: Self-pay | Source: Ambulatory Visit | Attending: Obstetrics & Gynecology | Admitting: Obstetrics & Gynecology

## 2016-11-18 ENCOUNTER — Ambulatory Visit (INDEPENDENT_AMBULATORY_CARE_PROVIDER_SITE_OTHER): Payer: Medicaid Other | Admitting: Obstetrics & Gynecology

## 2016-11-18 ENCOUNTER — Encounter: Payer: Self-pay | Admitting: Obstetrics & Gynecology

## 2016-11-18 VITALS — BP 128/72 | HR 82 | Ht 63.0 in | Wt 133.5 lb

## 2016-11-18 DIAGNOSIS — Z309 Encounter for contraceptive management, unspecified: Secondary | ICD-10-CM | POA: Diagnosis not present

## 2016-11-18 DIAGNOSIS — Z3009 Encounter for other general counseling and advice on contraception: Secondary | ICD-10-CM

## 2016-11-18 DIAGNOSIS — Z01419 Encounter for gynecological examination (general) (routine) without abnormal findings: Secondary | ICD-10-CM | POA: Insufficient documentation

## 2016-11-18 NOTE — Progress Notes (Signed)
Subjective:     Vickie Lloyd is a 26 y.o. female here for a routine exam.  Patient's last menstrual period was 10/30/2016. [id number] Birth Control Method:  Nexplanon plus megace suppression Menstrual Calendar(currently): amenorrheic since starting the megestrol, now on 40 mg daily  Current complaints: none.   Current acute medical issues:  none   Recent Gynecologic History Patient's last menstrual period was 10/30/2016. Last Pap: 2015,  normal Last mammogram: n/a,    Past Medical History:  Diagnosis Date  . Migraine     Past Surgical History:  Procedure Laterality Date  . birthmark removed as a child    . NO PAST SURGERIES      OB History    Gravida Para Term Preterm AB Living   0 0 2   SAB TAB Ectopic Multiple Live Births   0 0 0 0 2      Social History   Social History  . Marital status: Divorced    Spouse name: N/A  . Number of children: N/A  . Years of education: N/A   Social History Main Topics  . Smoking status: Never Smoker  . Smokeless tobacco: Never Used  . Alcohol use Yes     Comment: rarely  . Drug use: No  . Sexual activity: Yes    Birth control/ protection: Implant   Other Topics Concern  . None   Social History Narrative  . None    Family History  Problem Relation Age of Onset  . Heart attack Paternal Grandfather   . Stroke Maternal Grandmother   . Suicidality Maternal Grandfather   . Diabetes Father      Current Outpatient Prescriptions:  .  etonogestrel (NEXPLANON) 68 MG IMPL implant, 1 each by Subdermal route once., Disp: , Rfl:  .  ketorolac (TORADOL) 10 MG tablet, Take 1 tablet (10 mg total) by mouth every 8 (eight) hours as needed., Disp: 15 tablet, Rfl: 0 .  megestrol (MEGACE) 40 MG tablet, 3 tablets a day for 5 days, 2 tablets a day for 5 days then 1 tablet daily, Disp: 45 tablet, Rfl: 3  Review of Systems  Review of Systems  Constitutional: Negative for fever, chills, weight loss, malaise/fatigue and diaphoresis.   HENT: Negative for hearing loss, ear pain, nosebleeds, congestion, sore throat, neck pain, tinnitus and ear discharge.   Eyes: Negative for blurred vision, double vision, photophobia, pain, discharge and redness.  Respiratory: Negative for cough, hemoptysis, sputum production, shortness of breath, wheezing and stridor.   Cardiovascular: Negative for chest pain, palpitations, orthopnea, claudication, leg swelling and PND.  Gastrointestinal: negative for abdominal pain. Negative for heartburn, nausea, vomiting, diarrhea, constipation, blood in stool and melena.  Genitourinary: Negative for dysuria, urgency, frequency, hematuria and flank pain.  Musculoskeletal: Negative for myalgias, back pain, joint pain and falls.  Skin: Negative for itching and rash.  Neurological: Negative for dizziness, tingling, tremors, sensory change, speech change, focal weakness, seizures, loss of consciousness, weakness and headaches.  Endo/Heme/Allergies: Negative for environmental allergies and polydipsia. Does not bruise/bleed easily.  Psychiatric/Behavioral: Negative for depression, suicidal ideas, hallucinations, memory loss and substance abuse. The patient is not nervous/anxious and does not have insomnia.        Objective:  Blood pressure 128/72, pulse 82, height  (1.6 m), weight 133 lb 8 oz (60.6 kg), last menstrual period 10/30/2016.   Physical Exam  Vitals reviewed. Constitutional: She is oriented to person, place, and time. She appears well-developed and well-nourished.  HENT:  Head: Normocephalic and atraumatic.        Right Ear: External ear normal.  Left Ear: External ear normal.  Nose: Nose normal.  Mouth/Throat: Oropharynx is clear and moist.  Eyes: Conjunctivae and EOM are normal. Pupils are equal, round, and reactive to light. Right eye exhibits no discharge. Left eye exhibits no discharge. No scleral icterus.  Neck: Normal range of motion. Neck supple. No tracheal deviation present. No  thyromegaly present.  Cardiovascular: Normal rate, regular rhythm, normal heart sounds and intact distal pulses.  Exam reveals no gallop and no friction rub.   No murmur heard. Respiratory: Effort normal and breath sounds normal. No respiratory distress. She has no wheezes. She has no rales. She exhibits no tenderness.  GI: Soft. Bowel sounds are normal. She exhibits no distension and no mass. There is no tenderness. There is no rebound and no guarding.  Genitourinary:  Breasts no masses skin changes or nipple changes bilaterally      Vulva is normal without lesions Vagina is pink moist without discharge Cervix normal in appearance and pap is done Uterus is normal size shape and contour Adnexa is negative with normal sized ovaries   Musculoskeletal: Normal range of motion. She exhibits no edema and no tenderness.  Neurological: She is alert and oriented to person, place, and time. She has normal reflexes. She displays normal reflexes. No cranial nerve deficit. She exhibits normal muscle tone. Coordination normal.  Skin: Skin is warm and dry. No rash noted. No erythema. No pallor.  Psychiatric: She has a normal mood and affect. Her behavior is normal. Judgment and thought content normal.       Medications Ordered at today's visit: No orders of the defined types were placed in this encounter.   Other orders placed at today's visit: Orders Placed This Encounter  Procedures  . HIV antibody  . RPR      Assessment:    Healthy female exam.    Plan:    Contraception: Nexplanon. Follow up in: 3 months. removal of nexplanon     Return in about 3 months (around 02/17/2017).

## 2016-11-19 LAB — RPR: RPR: NONREACTIVE

## 2016-11-19 LAB — HIV ANTIBODY (ROUTINE TESTING W REFLEX): HIV Screen 4th Generation wRfx: NONREACTIVE

## 2016-11-21 LAB — CYTOLOGY - PAP
CHLAMYDIA, DNA PROBE: NEGATIVE
Diagnosis: NEGATIVE
NEISSERIA GONORRHEA: NEGATIVE

## 2017-02-17 ENCOUNTER — Ambulatory Visit: Payer: Medicaid Other | Admitting: Obstetrics & Gynecology

## 2017-03-10 ENCOUNTER — Encounter: Payer: Self-pay | Admitting: *Deleted

## 2017-03-10 ENCOUNTER — Ambulatory Visit: Payer: Medicaid Other | Admitting: Obstetrics and Gynecology

## 2017-03-20 ENCOUNTER — Encounter: Payer: Self-pay | Admitting: Adult Health

## 2017-03-20 ENCOUNTER — Ambulatory Visit (INDEPENDENT_AMBULATORY_CARE_PROVIDER_SITE_OTHER): Payer: Medicaid Other | Admitting: Adult Health

## 2017-03-20 VITALS — BP 100/60 | HR 82 | Ht 63.0 in | Wt 139.4 lb

## 2017-03-20 DIAGNOSIS — Z3202 Encounter for pregnancy test, result negative: Secondary | ICD-10-CM

## 2017-03-20 DIAGNOSIS — Z3049 Encounter for surveillance of other contraceptives: Secondary | ICD-10-CM | POA: Diagnosis not present

## 2017-03-20 DIAGNOSIS — Z3046 Encounter for surveillance of implantable subdermal contraceptive: Secondary | ICD-10-CM

## 2017-03-20 DIAGNOSIS — Z975 Presence of (intrauterine) contraceptive device: Secondary | ICD-10-CM

## 2017-03-20 DIAGNOSIS — Z30017 Encounter for initial prescription of implantable subdermal contraceptive: Secondary | ICD-10-CM

## 2017-03-20 LAB — POCT URINE PREGNANCY: Preg Test, Ur: NEGATIVE

## 2017-03-20 MED ORDER — ETONOGESTREL 68 MG ~~LOC~~ IMPL
68.0000 mg | DRUG_IMPLANT | Freq: Once | SUBCUTANEOUS | Status: AC
  Administered 2017-03-20: 68 mg via SUBCUTANEOUS

## 2017-03-20 NOTE — Progress Notes (Signed)
Subjective:     Patient ID: Vickie Lloyd, female   DOB: 08/28/1990, 27 y.o.   MRN: [id number]  HPI Vickie Lloyd is a [age] year old white female in for nexplanon removal and reinsertion.   Review of Systems For nexplanon removal and reinsertion Reviewed past medical,surgical, social and family history. Reviewed medications and allergies.     Objective:   Physical Exam BP 100/60 (BP Location: Left Arm, Patient Position: Sitting, Cuff Size: Small)   Pulse 82   Ht 5\' 3"  (1.6 m)   Wt 139 lb 6.4 oz (63.2 kg)   LMP 03/13/2017   BMI 24.69 kg/m UPT negative.consent signed and time out called.Right arm cleansed with betadine, and injected with 2.5 cc 1% lidocaine and waited til numb.Under sterile technique a #11 blade was used to make small vertical incision, and a curved forceps was used to easily remove rod. Then new nexplanon rod easily inserted and palpated by pt and provider and steri strips applied then pressure dressing applied.  This will be her third nexplanon.     Assessment:     1. Encounter for Nexplanon removal   2. Nexplanon insertion   3. Pregnancy examination or test, negative result      LOT # [id number], exp 3/21 Plan:     Use condoms x 2 weeks, keep clean and dry x 24 hours, no heavy lifting, keep steri strips on x 72 hours, Keep pressure dressing on x 24 hours. Follow up prn problems. Remove in 3 years

## 2017-03-20 NOTE — Patient Instructions (Signed)
Use condoms x 2 weeks, keep clean and dry x 24 hours, no heavy lifting, keep steri strips on x 72 hours, Keep pressure dressing on x 24 hours. Follow up prn problems.  

## 2017-03-20 NOTE — Addendum Note (Signed)
Addended by: Federico FlakeNES, PEGGY A on: 03/20/2017 05:10 PM   Modules accepted: Orders

## 2017-04-25 DIAGNOSIS — Z23 Encounter for immunization: Secondary | ICD-10-CM | POA: Diagnosis not present

## 2017-05-01 ENCOUNTER — Ambulatory Visit: Payer: Medicaid Other | Admitting: Physician Assistant

## 2017-05-05 ENCOUNTER — Encounter: Payer: Self-pay | Admitting: Physician Assistant

## 2017-07-22 DIAGNOSIS — Z1339 Encounter for screening examination for other mental health and behavioral disorders: Secondary | ICD-10-CM | POA: Diagnosis not present

## 2017-07-22 DIAGNOSIS — Z1331 Encounter for screening for depression: Secondary | ICD-10-CM | POA: Diagnosis not present

## 2017-07-22 DIAGNOSIS — Z008 Encounter for other general examination: Secondary | ICD-10-CM | POA: Diagnosis not present

## 2017-07-22 DIAGNOSIS — Z719 Counseling, unspecified: Secondary | ICD-10-CM | POA: Diagnosis not present

## 2017-08-06 ENCOUNTER — Encounter: Payer: Self-pay | Admitting: Family Medicine

## 2017-08-06 ENCOUNTER — Ambulatory Visit (INDEPENDENT_AMBULATORY_CARE_PROVIDER_SITE_OTHER): Payer: BLUE CROSS/BLUE SHIELD | Admitting: Family Medicine

## 2017-08-06 ENCOUNTER — Ambulatory Visit (INDEPENDENT_AMBULATORY_CARE_PROVIDER_SITE_OTHER): Payer: BLUE CROSS/BLUE SHIELD

## 2017-08-06 VITALS — BP 97/62 | HR 61 | Temp 97.3°F | Ht 63.0 in | Wt 133.0 lb

## 2017-08-06 DIAGNOSIS — R310 Gross hematuria: Secondary | ICD-10-CM

## 2017-08-06 DIAGNOSIS — R1031 Right lower quadrant pain: Secondary | ICD-10-CM | POA: Diagnosis not present

## 2017-08-06 DIAGNOSIS — R300 Dysuria: Secondary | ICD-10-CM | POA: Diagnosis not present

## 2017-08-06 LAB — URINALYSIS, COMPLETE
BILIRUBIN UA: NEGATIVE
GLUCOSE, UA: NEGATIVE
KETONES UA: NEGATIVE
LEUKOCYTES UA: NEGATIVE
Nitrite, UA: NEGATIVE
SPEC GRAV UA: 1.025 (ref 1.005–1.030)
Urobilinogen, Ur: 1 mg/dL (ref 0.2–1.0)
pH, UA: 6 (ref 5.0–7.5)

## 2017-08-06 LAB — MICROSCOPIC EXAMINATION
BACTERIA UA: NONE SEEN
Renal Epithel, UA: NONE SEEN /hpf

## 2017-08-06 MED ORDER — TAMSULOSIN HCL 0.4 MG PO CAPS: 0.4000 mg | ORAL_CAPSULE | Freq: Every day | ORAL | 0 refills | Status: DC

## 2017-08-06 MED ORDER — PHENAZOPYRIDINE HCL 200 MG PO TABS: 200.0000 mg | ORAL_TABLET | Freq: Three times a day (TID) | ORAL | 0 refills | Status: DC | PRN

## 2017-08-06 MED ORDER — CEPHALEXIN 500 MG PO CAPS: 500.0000 mg | ORAL_CAPSULE | Freq: Four times a day (QID) | ORAL | 0 refills | Status: AC

## 2017-08-06 NOTE — Progress Notes (Signed)
Subjective: CC: ?renal stone PCP: Remus LofflerJones, Angel S, PA-C ZOX:WRUEAHPI:Vickie Lloyd is a 27 y.o. female presenting to clinic today for:  1. Renal stone Patient reports that she developed right-sided low back pain radiating to the right lower abdomen, dysuria and gross hematuria last evening.  Denies any associated fevers, chills, nausea or vomiting.  She was treated for renal stone in the past.  She notes that this was a clinical diagnosis.  Her symptoms at that time felt very similar and resolved with the medications that were provided during that visit.  Her last menstrual period was 1 week ago and was normal.   ROS: Per HPI  No Known Allergies Past Medical History:  Diagnosis Date  . Migraine     Current Outpatient Medications:  .  etonogestrel (NEXPLANON) 68 MG IMPL implant, 1 each by Subdermal route once., Disp: , Rfl:  Social History   Socioeconomic History  . Marital status: Divorced    Spouse name: Not on file  . Number of children: Not on file  . Years of education: Not on file  . Highest education level: Not on file  Occupational History  . Not on file  Social Needs  . Financial resource strain: Not on file  . Food insecurity:    Worry: Not on file    Inability: Not on file  . Transportation needs:    Medical: Not on file    Non-medical: Not on file  Tobacco Use  . Smoking status: Never Smoker  . Smokeless tobacco: Never Used  Substance and Sexual Activity  . Alcohol use: Yes    Comment: rarely  . Drug use: No  . Sexual activity: Yes    Birth control/protection: Implant  Lifestyle  . Physical activity:    Days per week: Not on file    Minutes per session: Not on file  . Stress: Not on file  Relationships  . Social connections:    Talks on phone: Not on file    Gets together: Not on file    Attends religious service: Not on file    Active member of club or organization: Not on file    Attends meetings of clubs or organizations: Not on file    Relationship  status: Not on file  . Intimate partner violence:    Fear of current or ex partner: Not on file    Emotionally abused: Not on file    Physically abused: Not on file    Forced sexual activity: Not on file  Other Topics Concern  . Not on file  Social History Narrative  . Not on file   Family History  Problem Relation Age of Onset  . Heart attack Paternal Grandfather   . Stroke Maternal Grandmother   . Suicidality Maternal Grandfather   . Diabetes Father     Objective: Office vital signs reviewed. BP 97/62   Pulse 61   Temp (!) 97.3 F (36.3 C) (Oral)   Ht 5\' 3"  (1.6 m)   Wt 133 lb (60.3 kg)   BMI 23.56 kg/m   Physical Examination:  General: Awake, alert, well nourished, nontoxic, No acute distress GI: soft, non-tender, non-distended, bowel sounds present x4, no hepatomegaly, no splenomegaly, no masses GU: + suprapubic TTP. No CVA TTP Extremities: warm, well perfused, No edema, cyanosis or clubbing; +2 pulses bilaterally MSK: +right lumbar paraspinal TTP  Dg Abd 1 View  Result Date: 08/06/2017 CLINICAL DATA:  Right lower quadrant pain and hematuria. Possible kidney stones. EXAM:  ABDOMEN - 1 VIEW COMPARISON:  None in PACs FINDINGS: There are no definite abnormal calcifications projecting over the urinary tracts. There is asymmetric density of the L5 transverse process on the right as compared to the left that could indicate an underlying stone however. The colonic stool burden is moderately increased. There is no evidence of bowel obstruction. The bony structures exhibit no acute abnormalities. IMPRESSION: No definite calcified urinary tract stones. If urinary tract stones are strongly suspected, a noncontrast abdominal and pelvic CT scan would be useful. Moderately increase colonic stool burden may reflect constipation in the appropriate clinical setting. Electronically Signed   By: David  [place] M.D.   On: 08/06/2017 14:51    Assessment/ Plan: 27 y.o. female   1. Gross  hematuria Personal review of KUB did not demonstrate any obvious renal stones.  She does have quite a bit of stool burden.  This was reinforced by radiologist review.  Her urinalysis was remarkable for 3+ blood.  Urine microscopy with 3-10 red blood cells and mucus present.  Given her reports of gross hematuria and absence of bacteria on microscopy, we will proceed with Flomax prescription.  I have also gone ahead and placed her on an oral antibiotic to cover for urinary tract infection.  Will send urine for urine culture.  We discussed that if symptoms are not improving over the next couple of days or substantially worsen that she should seek immediate medical attention the emergency department. - Urinalysis, Complete - DG Abd 1 View; Future  2. Dysuria - DG Abd 1 View; Future   Orders Placed This Encounter  Procedures  . Urine Culture  . DG Abd 1 View    Standing Status:   Future    Number of Occurrences:   1    Standing Expiration Date:   10/06/2018    Order Specific Question:   Reason for Exam (SYMPTOM  OR DIAGNOSIS REQUIRED)    Answer:   evaluated for renal stone on right    Order Specific Question:   Is the patient pregnant?    Answer:   No    Order Specific Question:   Preferred imaging location?    Answer:   Internal  . Urinalysis, Complete   Meds ordered this encounter  Medications  . cephALEXin (KEFLEX) 500 MG capsule    Sig: Take 1 capsule (500 mg total) by mouth 4 (four) times daily for 7 days.    Dispense:  28 capsule    Refill:  0  . phenazopyridine (PYRIDIUM) 200 MG tablet    Sig: Take 1 tablet (200 mg total) by mouth 3 (three) times daily as needed for pain.    Dispense:  9 tablet    Refill:  0  . tamsulosin (FLOMAX) 0.4 MG CAPS capsule    Sig: Take 1 capsule (0.4 mg total) by mouth daily. For renal stone    Dispense:  5 capsule    Refill:  0    Dragon Thrush Hulen Skains, DO [facility] [phone]

## 2017-08-06 NOTE — Patient Instructions (Addendum)
Your x-ray today did not demonstrate an obvious stone.  However some of these cannot be appreciated on x-ray.  I favor that you are having a urinary tract infection.  I sent you an antibiotic to take 4 times a day for the next 7 days to cover for any kidney infection as well.  I have also sent in Pyridium for you to take up to 3 times daily if needed for burning when you pee.  Do not use this longer than the next 2 days.  Because there was a fair amount of blood on your urine study, I DO want you to start the flomax today.  If you become dizzy STOP the medication.   If your symptoms are not improving in the next 2 to 3 days with antibiotics or if they significantly worsen, please seek immediate medical attention.   Urinary Tract Infection, Adult A urinary tract infection (UTI) is an infection of any part of the urinary tract. The urinary tract includes the:  Kidneys.  Ureters.  Bladder.  Urethra.  These organs make, store, and get rid of pee (urine) in the body. Follow these instructions at home:  Take over-the-counter and prescription medicines only as told by your doctor.  If you were prescribed an antibiotic medicine, take it as told by your doctor. Do not stop taking the antibiotic even if you start to feel better.  Avoid the following drinks: ? Alcohol. ? Caffeine. ? Tea. ? Carbonated drinks.  Drink enough fluid to keep your pee clear or pale yellow.  Keep all follow-up visits as told by your doctor. This is important.  Make sure to: ? Empty your bladder often and completely. Do not to hold pee for long periods of time. ? Empty your bladder before and after sex. ? Wipe from front to back after a bowel movement if you are female. Use each tissue one time when you wipe. Contact a doctor if:  You have back pain.  You have a fever.  You feel sick to your stomach (nauseous).  You throw up (vomit).  Your symptoms do not get better after 3 days.  Your symptoms go away  and then come back. Get help right away if:  You have very bad back pain.  You have very bad lower belly (abdominal) pain.  You are throwing up and cannot keep down any medicines or water. This information is not intended to replace advice given to you by your health care provider. Make sure you discuss any questions you have with your health care provider. Document Released: 08/07/2007 Document Revised: 07/27/2015 Document Reviewed: 01/09/2015 Elsevier Interactive Patient Education  [employer]2018 Elsevier Inc.

## 2017-08-07 LAB — URINE CULTURE

## 2017-09-30 ENCOUNTER — Encounter: Payer: Self-pay | Admitting: Family Medicine

## 2017-09-30 ENCOUNTER — Ambulatory Visit (INDEPENDENT_AMBULATORY_CARE_PROVIDER_SITE_OTHER): Payer: BLUE CROSS/BLUE SHIELD | Admitting: Family Medicine

## 2017-09-30 VITALS — BP 101/69 | HR 91 | Temp 98.3°F | Ht 63.0 in | Wt 134.0 lb

## 2017-09-30 DIAGNOSIS — J029 Acute pharyngitis, unspecified: Secondary | ICD-10-CM

## 2017-09-30 DIAGNOSIS — H66003 Acute suppurative otitis media without spontaneous rupture of ear drum, bilateral: Secondary | ICD-10-CM

## 2017-09-30 LAB — RAPID STREP SCREEN (MED CTR MEBANE ONLY): STREP GP A AG, IA W/REFLEX: NEGATIVE

## 2017-09-30 LAB — CULTURE, GROUP A STREP

## 2017-09-30 MED ORDER — AMOXICILLIN-POT CLAVULANATE 875-125 MG PO TABS: 1.0000 | ORAL_TABLET | Freq: Two times a day (BID) | ORAL | 0 refills | Status: DC

## 2017-09-30 NOTE — Patient Instructions (Signed)
He may continue the ibuprofen and Tylenol for pain and fever.  I have added Augmentin for you to take twice a day for the next 10 days to cover for ear infection.  This will also cover for strep.  Your rapid was negative today.  Otitis Media, Adult Otitis media is redness, soreness, and puffiness (swelling) in the space just behind your eardrum (middle ear). It may be caused by allergies or infection. It often happens along with a cold. Follow these instructions at home:  Take your medicine as told. Finish it even if you start to feel better.  Only take over-the-counter or prescription medicines for pain, discomfort, or fever as told by your doctor.  Follow up with your doctor as told. Contact a doctor if:  You have otitis media only in one ear, or bleeding from your nose, or both.  You notice a lump on your neck.  You are not getting better in 3-5 days.  You feel worse instead of better. Get help right away if:  You have pain that is not helped with medicine.  You have puffiness, redness, or pain around your ear.  You get a stiff neck.  You cannot move part of your face (paralysis).  You notice that the bone behind your ear hurts when you touch it. This information is not intended to replace advice given to you by your health care provider. Make sure you discuss any questions you have with your health care provider. Document Released: 08/07/2007 Document Revised: 07/27/2015 Document Reviewed: 09/15/2012 Elsevier Interactive Patient Education  2017 [employer]

## 2017-09-30 NOTE — Progress Notes (Signed)
Subjective: CC: ear pain PCP: Remus Loffler, PA-C Vickie Lloyd is a 27 y.o. female presenting to clinic today for:  1. Ear pain Patient reports a several day history of right ear pain, sore throat, fevers to 102 F and chills.  Denies any nausea, vomiting, diarrhea.  No cough.  No shortness of breath.  She has had associated intermittent rhinorrhea and nasal congestion.  There are multiple sick contacts at work with strep.  She has been using ibuprofen, Tylenol alternating with last dose a few hours ago.  She is missed work today secondary to sickness.   ROS: Per HPI  No Known Allergies Past Medical History:  Diagnosis Date  . Migraine     Current Outpatient Medications:  .  etonogestrel (NEXPLANON) 68 MG IMPL implant, 1 each by Subdermal route once., Disp: , Rfl:  .  amoxicillin-clavulanate (AUGMENTIN) 875-125 MG tablet, Take 1 tablet by mouth 2 (two) times daily., Disp: 20 tablet, Rfl: 0 Social History   Socioeconomic History  . Marital status: Divorced    Spouse name: Not on file  . Number of children: Not on file  . Years of education: Not on file  . Highest education level: Not on file  Occupational History  . Not on file  Social Needs  . Financial resource strain: Not on file  . Food insecurity:    Worry: Not on file    Inability: Not on file  . Transportation needs:    Medical: Not on file    Non-medical: Not on file  Tobacco Use  . Smoking status: Never Smoker  . Smokeless tobacco: Never Used  Substance and Sexual Activity  . Alcohol use: Yes    Comment: rarely  . Drug use: No  . Sexual activity: Yes    Birth control/protection: Implant  Lifestyle  . Physical activity:    Days per week: Not on file    Minutes per session: Not on file  . Stress: Not on file  Relationships  . Social connections:    Talks on phone: Not on file    Gets together: Not on file    Attends religious service: Not on file    Active member of club or organization: Not on  file    Attends meetings of clubs or organizations: Not on file    Relationship status: Not on file  . Intimate partner violence:    Fear of current or ex partner: Not on file    Emotionally abused: Not on file    Physically abused: Not on file    Forced sexual activity: Not on file  Other Topics Concern  . Not on file  Social History Narrative  . Not on file   Family History  Problem Relation Age of Onset  . Heart attack Paternal Grandfather   . Stroke Maternal Grandmother   . Suicidality Maternal Grandfather   . Diabetes Father     Objective: Office vital signs reviewed. BP 101/69 (BP Location: Left Arm)   Pulse 91   Temp 98.3 F (36.8 C) (Oral)   Ht 5\' 3"  (1.6 m)   Wt 134 lb (60.8 kg)   LMP 09/23/2017 (Exact Date)   BMI 23.74 kg/m   Physical Examination:  General: Awake, alert, well nourished, No acute distress HEENT: Normal    Neck: No masses palpated.  Mild enlargement of bilateral anterior cervical lymph nodes    Ears: Tympanic membranes intact, dulled light reflex bilaterally, erythema on right, bulging bilaterally  Eyes: PERRLA, extraocular membranes intact, sclera white    Nose: nasal turbinates moist, no nasal discharge    Throat: moist mucus membranes, moderate oropharyngeal erythema, no tonsillar exudate.  Airway is patent Cardio: regular rate and rhythm, S1S2 heard, no murmurs appreciated Pulm: clear to auscultation bilaterally, no wheezes, rhonchi or rales; normal work of breathing on room air   Assessment/ Plan: 27 y.o. female   1. Non-recurrent acute suppurative otitis media of both ears without spontaneous rupture of tympanic membranes Patient afebrile and nontoxic-appearing on exam.  Vital signs normal.  Physical exam was remarkable for findings consistent with bilateral ear infection.  Rapid strep was negative.  She is placed on Augmentin 875 p.o. twice daily for the next 10 days.  Home care instructions reviewed with the patient.  Reasons for  return discussed.  May continue ibuprofen/Tylenol if needed.  Work note provided.  2. Sore throat - Rapid Strep Screen ([facility] ONLY)  Orders Placed This Encounter  Procedures  . Rapid Strep Screen ([facility] ONLY)  . Culture, Group A Strep   Meds ordered this encounter  Medications  . amoxicillin-clavulanate (AUGMENTIN) 875-125 MG tablet    Sig: Take 1 tablet by mouth 2 (two) times daily.    Dispense:  20 tablet    Refill:  0     Vickie Hulen SkainsM Gottschalk, DO [facility] [phone]

## 2018-04-24 ENCOUNTER — Telehealth: Payer: Self-pay | Admitting: *Deleted

## 2018-04-24 ENCOUNTER — Other Ambulatory Visit: Payer: Self-pay | Admitting: Physician Assistant

## 2018-04-24 MED ORDER — OSELTAMIVIR PHOSPHATE 75 MG PO CAPS: 75.0000 mg | ORAL_CAPSULE | Freq: Two times a day (BID) | ORAL | 0 refills | Status: DC

## 2018-04-24 NOTE — Telephone Encounter (Signed)
Patient called stating that daughter tested positive for the flu on 2/18.  Patient would like to have rx of tamiflu to be sent to pharmacy

## 2018-04-24 NOTE — Telephone Encounter (Signed)
Number unavailable-cb 2/21

## 2018-04-24 NOTE — Telephone Encounter (Signed)
sent 

## 2018-08-26 ENCOUNTER — Encounter: Payer: Medicaid Other | Admitting: Adult Health

## 2018-11-17 ENCOUNTER — Encounter: Payer: Self-pay | Admitting: Obstetrics and Gynecology

## 2018-11-17 ENCOUNTER — Other Ambulatory Visit: Payer: Self-pay

## 2018-11-17 ENCOUNTER — Ambulatory Visit (INDEPENDENT_AMBULATORY_CARE_PROVIDER_SITE_OTHER): Payer: Medicaid Other | Admitting: Obstetrics and Gynecology

## 2018-11-17 VITALS — BP 99/73 | HR 75 | Ht 65.0 in | Wt 147.2 lb

## 2018-11-17 DIAGNOSIS — Z3046 Encounter for surveillance of implantable subdermal contraceptive: Secondary | ICD-10-CM | POA: Diagnosis not present

## 2018-11-17 MED ORDER — NORETHIN ACE-ETH ESTRAD-FE 1-20 MG-MCG PO TABS: 1.0000 | ORAL_TABLET | Freq: Every day | ORAL | 3 refills | Status: DC

## 2018-11-17 NOTE — Progress Notes (Signed)
Patient ID: Vickie Lloyd, female   DOB: May 31, 1990, 28 y.o.   MRN: [id number]    GYNECOLOGY OFFICE PROCEDURE NOTE  Vickie Lloyd is a 28 y.o. [phone] here for Nexplanon removal.  Last pap smear was on 11/18/2016 and was normal. Has had 3 Implanons, never had serious bleeding on implanon until now. 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 Has been bleeding for past 2 months. Is getting married next month and fiancee is considering vasectomy. Vickie Lloyd already has 2 kids. No history of DVT. Has taken OCP before she got pregnant with her first child. Has plans to use OCP for [facility].  Nexplanon Removal Patient identified, informed consent performed, consent signed.   Appropriate time out taken. Nexplanon site identified.  Area prepped in usual sterile fashon. One ml of 1% lidocaine was used to anesthetize the area at the distal end of the implant. A small stab incision was made right beside the implant on the distal portion.  The Nexplanon rod was grasped using hemostats and removed without difficulty.  There was minimal blood loss. There were no complications.  3 ml of 1% lidocaine was injected around the incision for post-procedure analgesia.  Steri-strips were applied over the small incision.  A pressure bandage was applied to reduce any bruising.  The patient tolerated the procedure well and was given post procedure instructions.  Patient is planning to use OCP for contraception. Procedure done in tandem by Dr Maudie Mercury and myself.  By signing my name below, I, Vickie Lloyd, attest that this documentation has been prepared under the direction and in the presence of Jonnie Kind, MD. Electronically Signed: Katherine. 11/17/18. 10:53 AM.  I personally performed the services described in this documentation, which was SCRIBED in my presence. The recorded information has been reviewed and considered accurate. It has been edited as necessary  during review. Jonnie Kind, MD

## 2018-11-17 NOTE — Patient Instructions (Signed)
Oral Contraception Use Oral contraceptive pills (OCPs) are medicines that you take to prevent pregnancy. OCPs work by:  Preventing the ovaries from releasing eggs.  Thickening mucus in the lower part of the uterus (cervix), which prevents sperm from entering the uterus.  Thinning the lining of the uterus (endometrium), which prevents a fertilized egg from attaching to the endometrium. OCPs are highly effective when taken exactly as prescribed. However, OCPs do not prevent sexually transmitted infections (STIs). Safe sex practices, such as using condoms while on an OCP, can help prevent STIs. Before taking OCPs, you may have a physical exam, blood test, and Pap test. A Pap test involves taking a sample of cells from your cervix to check for cancer. Discuss with your health care provider the possible side effects of the OCP you may be prescribed. When you start an OCP, be aware that it can take 2-3 months for your body to adjust to changes in hormone levels. How to take oral contraceptive pills Follow instructions from your health care provider about how to start taking your first cycle of OCPs. Your health care provider may recommend that you:  Start the pill on day 1 of your menstrual period. If you start at this time, you will not need any backup form of birth control (contraception), such as condoms.  Start the pill on the first Sunday after your menstrual period or on the day you get your prescription. In these cases, you will need to use backup contraception for the first week.  Start the pill at any time of your cycle. ? If you take the pill within 5 days of the start of your period, you will not need a backup form of contraception. ? If you start at any other time of your menstrual cycle, you will need to use another form of contraception for 7 days. If your OCP is the type called a minipill, it will protect you from pregnancy after taking it for 2 days (48 hours), and you can stop using  backup contraception after that time. After you have started taking OCPs:  If you forget to take 1 pill, take it as soon as you remember. Take the next pill at the regular time.  If you miss 2 or more pills, call your health care provider. Different pills have different instructions for missed doses. Use backup birth control until your next menstrual period starts.  If you use a 28-day pack that contains inactive pills and you miss 1 of the last 7 pills (pills with no hormones), throw away the rest of the non-hormone pills and start a new pill pack. No matter which day you start the OCP, you will always start a new pack on that same day of the week. Have an extra pack of OCPs and a backup contraceptive method available in case you miss some pills or lose your OCP pack. Follow these instructions at home:  Do not use any products that contain nicotine or tobacco, such as cigarettes and e-cigarettes. If you need help quitting, ask your health care provider.  Always use a condom to protect against STIs. OCPs do not protect against STIs.  Use a calendar to mark the days of your menstrual period.  Read the information and directions that came with your OCP. Talk to your health care provider if you have questions. Contact a health care provider if:  You develop nausea and vomiting.  You have abnormal vaginal discharge or bleeding.  You develop a rash.    You miss your menstrual period. Depending on the type of OCP you are taking, this may be a sign of pregnancy. Ask your health care provider for more information.  You are losing your hair.  You need treatment for mood swings or depression.  You get dizzy when taking the OCP.  You develop acne after taking the OCP.  You become pregnant or think you may be pregnant.  You have diarrhea, constipation, and abdominal pain or cramps.  You miss 2 or more pills. Get help right away if:  You develop chest pain.  You develop shortness of  breath.  You have an uncontrolled or severe headache.  You develop numbness or slurred speech.  You develop visual or speech problems.  You develop pain, redness, and swelling in your legs.  You develop weakness or numbness in your arms or legs. Summary  Oral contraceptive pills (OCPs) are medicines that you take to prevent pregnancy.  OCPs do not prevent sexually transmitted infections (STIs). Always use a condom to protect against STIs.  When you start an OCP, be aware that it can take 2-3 months for your body to adjust to changes in hormone levels.  Read all the information and directions that come with your OCP. This information is not intended to replace advice given to you by your health care provider. Make sure you discuss any questions you have with your health care provider. Document Released: 02/07/2011 Document Revised: 06/12/2018 Document Reviewed: 04/01/2016 Elsevier Patient Education  2020 Elsevier Inc.  

## 2018-12-03 ENCOUNTER — Other Ambulatory Visit: Payer: Self-pay

## 2018-12-03 DIAGNOSIS — Z20822 Contact with and (suspected) exposure to covid-19: Secondary | ICD-10-CM

## 2018-12-04 LAB — NOVEL CORONAVIRUS, NAA: SARS-CoV-2, NAA: NOT DETECTED

## 2019-02-15 ENCOUNTER — Ambulatory Visit
Admission: EM | Admit: 2019-02-15 | Discharge: 2019-02-15 | Disposition: A | Discharge status: Home / Self Care | Payer: Medicaid Other | Attending: Emergency Medicine | Admitting: Emergency Medicine

## 2019-02-15 ENCOUNTER — Other Ambulatory Visit: Payer: Self-pay

## 2019-02-15 DIAGNOSIS — R1031 Right lower quadrant pain: Secondary | ICD-10-CM | POA: Insufficient documentation

## 2019-02-15 DIAGNOSIS — R1032 Left lower quadrant pain: Secondary | ICD-10-CM

## 2019-02-15 LAB — POCT URINALYSIS DIP (MANUAL ENTRY)
Bilirubin, UA: NEGATIVE
Blood, UA: NEGATIVE
Glucose, UA: NEGATIVE mg/dL
Ketones, POC UA: NEGATIVE mg/dL
Leukocytes, UA: NEGATIVE
Nitrite, UA: NEGATIVE
Protein Ur, POC: NEGATIVE mg/dL
Spec Grav, UA: 1.02 (ref 1.010–1.025)
Urobilinogen, UA: 2 E.U./dL — AB
pH, UA: 7.5 (ref 5.0–8.0)

## 2019-02-15 LAB — POCT URINE PREGNANCY: Preg Test, Ur: NEGATIVE

## 2019-02-15 NOTE — ED Triage Notes (Signed)
Pt presents with abdominal pain that hurts after eating, pt also has nausea with no vomiting  For past 2 weeks.

## 2019-02-15 NOTE — Discharge Instructions (Addendum)

## 2019-02-15 NOTE — ED Provider Notes (Signed)
[facility]    CSN: [id number] Arrival date & time: 02/15/19  1812      History   Chief Complaint Chief Complaint  Patient presents with  . Abdominal Pain    HPI Vickie Lloyd is a 28 y.o. female.   Complains of abdominal pain that started 14 days ago.  Denies a precipitating event, or specific injury.  Patient localizes pain to lower abdominal areas.  Describes it as constant and cramping in character.  Pain is associated with eating. She stated she had regular BM and it is soft. last BM was today.Has tried OTC medications without relief. Denies fever, chills, appetite change, weight change, chest pain,  vomiting, changes in bowel or bladder habits.  The history is provided by the patient. No language interpreter was used.  Abdominal Pain Pain location:  LLQ and RLQ Risk factors: no alcohol abuse, no aspirin use and no NSAID use     Past Medical History:  Diagnosis Date  . Migraine     Patient Active Problem List   Diagnosis Date Noted  . Nexplanon in place 02/08/2014    Past Surgical History:  Procedure Laterality Date  . birthmark removed as a child    . NO PAST SURGERIES      OB History    Gravida  2   Para  2   Term  2   Preterm  0   AB  0   Living  2     SAB  0   TAB  0   Ectopic  0   Multiple  0   Live Births  2            Home Medications    Prior to Admission medications   Medication Sig Start Date End Date Taking? Authorizing Provider  amoxicillin-clavulanate (AUGMENTIN) 875-125 MG tablet Take 1 tablet by mouth 2 (two) times daily. Patient not taking: Reported on 11/17/2018 09/30/17   Raliegh IpGottschalk, Ashly M, DO  etonogestrel (NEXPLANON) 68 MG IMPL implant 1 each by Subdermal route once.    [provider]  norethindrone-ethinyl estradiol (JUNEL FE 1/20) 1-20 MG-MCG tablet Take 1 tablet by mouth daily. 11/17/18   Melene PlanKim, Rachel E, MD  oseltamivir (TAMIFLU) 75 MG capsule Take 1 capsule (75 mg total) by mouth 2  (two) times daily. Patient not taking: Reported on 11/17/2018 04/24/18   Remus LofflerJones, Angel S, PA-C    Family History Family History  Problem Relation Age of Onset  . Heart attack Paternal Grandfather   . Stroke Maternal Grandmother   . Suicidality Maternal Grandfather   . Diabetes Father     Social History Social History   Tobacco Use  . Smoking status: Never Smoker  . Smokeless tobacco: Never Used  Substance Use Topics  . Alcohol use: Yes    Comment: rarely  . Drug use: No     Allergies   Patient has no known allergies.   Review of Systems Review of Systems  Constitutional: Negative.   HENT: Negative.   Respiratory: Negative.   Cardiovascular: Negative.   Gastrointestinal: Positive for abdominal pain.  ROS: All other are negative  Physical Exam Triage Vital Signs ED Triage Vitals  Enc Vitals Group     BP 02/15/19 1831 108/70     Pulse Rate 02/15/19 1831 78     Resp 02/15/19 1831 18     Temp 02/15/19 1831 98.2 F (36.8 C)     Temp src --  SpO2 02/15/19 1831 98 %     Weight --      Height --      Head Circumference --      Peak Flow --      Pain Score 02/15/19 1829 5     Pain Loc --      Pain Edu? --      Excl. in GC? --    No data found.  Updated Vital Signs BP 108/70   Pulse 78   Temp 98.2 F (36.8 C)   Resp 18   LMP 02/01/2019   SpO2 98%   Visual Acuity Right Eye Distance:   Left Eye Distance:   Bilateral Distance:    Right Eye Near:   Left Eye Near:    Bilateral Near:     Physical Exam Vitals and nursing note reviewed.  Constitutional:      General: She is not in acute distress.    Appearance: She is well-developed. She is not ill-appearing or toxic-appearing.  Cardiovascular:     Rate and Rhythm: Normal rate and regular rhythm.  Pulmonary:     Effort: Pulmonary effort is normal. No respiratory distress.     Breath sounds: Normal breath sounds.  Chest:     Chest wall: No tenderness.  Abdominal:     General: Abdomen is flat.  Bowel sounds are normal. There is no distension. There are no signs of injury.     Palpations: Abdomen is soft. There is no shifting dullness.     Tenderness: There is abdominal tenderness in the right lower quadrant and left lower quadrant. There is no guarding or rebound.     Hernia: No hernia is present.  Neurological:     Mental Status: She is alert.      UC Treatments / Results  Labs (all labs ordered are listed, but only abnormal results are displayed) Labs Reviewed  POCT URINALYSIS DIP (MANUAL ENTRY) - Abnormal; Notable for the following components:      Result Value   Clarity, UA cloudy (*)    Urobilinogen, UA 2.0 (*)    All other components within normal limits  URINE CULTURE  POCT URINE PREGNANCY    EKG   Radiology No results found.  Procedures Procedures (including critical care time)  Medications Ordered in UC Medications - No data to display  Initial Impression / Assessment and Plan / UC Course  I have reviewed the triage vital signs and the nursing notes.  Pertinent labs & imaging results that were available during my care of the patient were reviewed by me and considered in my medical decision making (see chart for details).    Patient stable for discharge.  Benign physical exam.  UA was inconclusive.  Urine sent for culture.  Could not rule out appendicitis or other disease process, advised patient to follow-up with primary care for further GI evaluation, or go to ED if symptoms get worse.  Final Clinical Impressions(s) / UC Diagnoses   Final diagnoses:  Acute bilateral lower abdominal pain     Discharge Instructions     Get rest and drink fluids Zofran prescribed.  Take as directed.    DIET Instructions:  30 minutes after taking nausea medicine, begin with sips of clear liquids. If able to hold down 2 - 4 ounces for 30 minutes, begin drinking more. Increase your fluid intake to replace losses. Clear liquids only for 24 hours (water, tea,  sport drinks, clear flat ginger ale or cola and  juices, broth, jello, popsicles, ect). Advance to bland foods, applesauce, rice, baked or boiled chicken, ect. Avoid milk, greasy foods and anything that doesn't agree with you.  If you experience new or worsening symptoms return or go to ER such as fever, chills, nausea, vomiting, diarrhea, bloody or dark tarry stools, constipation, urinary symptoms, worsening abdominal discomfort, symptoms that do not improve with medications, inability to keep fluids down, etc...    ED Prescriptions    None     PDMP not reviewed this encounter.   Emerson Monte, FNP 02/15/19 2007

## 2019-02-16 ENCOUNTER — Encounter (HOSPITAL_COMMUNITY): Payer: Self-pay

## 2019-02-16 ENCOUNTER — Ambulatory Visit (HOSPITAL_COMMUNITY)
Admission: RE | Admit: 2019-02-16 | Discharge: 2019-02-16 | Disposition: A | Discharge status: Home / Self Care | Payer: Medicaid Other | Source: Ambulatory Visit | Attending: Emergency Medicine | Admitting: Emergency Medicine

## 2019-02-16 DIAGNOSIS — R1030 Lower abdominal pain, unspecified: Secondary | ICD-10-CM | POA: Insufficient documentation

## 2019-02-16 DIAGNOSIS — R143 Flatulence: Secondary | ICD-10-CM | POA: Insufficient documentation

## 2019-02-17 LAB — URINE CULTURE: Culture: NO GROWTH

## 2019-05-10 DIAGNOSIS — J029 Acute pharyngitis, unspecified: Secondary | ICD-10-CM | POA: Diagnosis not present

## 2019-05-10 DIAGNOSIS — R05 Cough: Secondary | ICD-10-CM | POA: Diagnosis not present

## 2019-07-15 ENCOUNTER — Telehealth: Payer: Self-pay | Admitting: Emergency Medicine

## 2019-07-15 DIAGNOSIS — N939 Abnormal uterine and vaginal bleeding, unspecified: Secondary | ICD-10-CM

## 2019-07-15 NOTE — Progress Notes (Signed)
Hello Mrs Holle,   [place] sorry you are having irregular vaginal bleeding.    I have reviewed your chart.  It looks like you have had birth control implants and pills in the past but no longer using these.  From looking at your last OBGYN note it looks like you may have had irregular vaginal bleeding in the past and the implant was removed.    Based on what you shared with me, I feel your condition warrants further evaluation and I recommend that you be seen for a face to face visit.  It is difficult to determine the level of bleeding you are having, ask follow up questions or perform an exam via an e-visit.  Please contact your primary care physician practice or in your care your OBGYN to be seen.  They may suggest some prescription medicines to slow down or stop your  Many offices offer virtual options to be seen via video if you are not comfortable going in person to a medical facility at this time.  If you do not have a PCP, [facility] offers a free physician referral service available at [phone]. Our trained staff has the experience, knowledge and resources to put you in touch with a physician who is right for you.   You also have the option of a video visit through [email or web address]  If you are having a true medical emergency please call 911.  Go to the emergency department if you develop:  Heavy non stop vaginal bleeding or hemorrhage  Lower abdominal pain or flank pain   Fevers  Chest pain, shortness of breath, light headedness or passing out episodes   NOTE: If you entered your credit card information for this eVisit, you will not be charged. You may see a "hold" on your card for the $35 but that hold will drop off and you will not have a charge processed.  Your e-visit answers were reviewed by a board certified advanced clinical practitioner to complete your personal care plan.  Thank you for using e-Visits.

## 2019-07-16 ENCOUNTER — Other Ambulatory Visit: Payer: Self-pay

## 2019-07-16 ENCOUNTER — Encounter: Payer: Self-pay | Admitting: Obstetrics and Gynecology

## 2019-07-16 ENCOUNTER — Ambulatory Visit (INDEPENDENT_AMBULATORY_CARE_PROVIDER_SITE_OTHER): Payer: Self-pay | Admitting: Obstetrics and Gynecology

## 2019-07-16 VITALS — BP 110/74 | HR 73 | Ht 65.0 in | Wt 144.0 lb

## 2019-07-16 DIAGNOSIS — N939 Abnormal uterine and vaginal bleeding, unspecified: Secondary | ICD-10-CM

## 2019-07-16 DIAGNOSIS — Z3202 Encounter for pregnancy test, result negative: Secondary | ICD-10-CM

## 2019-07-16 LAB — POCT URINE PREGNANCY: Preg Test, Ur: NEGATIVE

## 2019-07-16 MED ORDER — NORGESTIMATE-ETH ESTRADIOL 0.25-35 MG-MCG PO TABS: 1.0000 | ORAL_TABLET | Freq: Every day | ORAL | 11 refills | Status: DC

## 2019-07-16 NOTE — Progress Notes (Signed)
Patient ID: Vickie Lloyd, female   DOB: Mar 19, 1990, 29 y.o.   MRN: [id number]    [facility] Clinic Visit  @DATE @            Patient name: Vickie Lloyd MRN Vickie Lloyd  Date of birth: December 26, 1990  CC & HPI:  Vickie Lloyd is a 29 y.o. female presenting today for evaluation of three-week history of irregular vaginal bleeding. She also reports nausea. She is not currently on any form of contraception. She had Nexplanon removed on 11/17/2018. Since then, she reports having approximately two periods monthly but as of recent, it has been been more persistent.  ROS:  ROS + irregular vaginal bleeding + nausea  Pertinent History Reviewed:   Reviewed Medical         Past Medical History:  Diagnosis Date  . Migraine                               Surgical Hx:    Past Surgical History:  Procedure Laterality Date  . birthmark removed as a child    . NO PAST SURGERIES     Medications: Reviewed & Updated - see associated section                      No current outpatient medications on file.   Social History: Reviewed -  reports that she has never smoked. She has never used smokeless tobacco.  Objective Findings:  Vitals: Blood pressure 110/74, pulse 73, height 5\' 5"  (1.651 m), weight 144 lb (65.3 kg), last menstrual period 07/01/2019.  PHYSICAL EXAMINATION General appearance - alert, well appearing, and in no distress Mental status - normal mood, behavior, speech, dress, motor activity, and thought processes, affect appropriate to mood Pelvic exam deferred.   Assessment & Plan:   A:  1.  Irregular vaginal bleeding suspect irregular ovulation  P:  1. The patient is interested in trying an oral contraceptive such as Sprintec.  By signing my name below, I, , attest that this documentation has been prepared under the direction and in the presence of 07/03/2019, MD. Electronically Signed: Maleeha Katherin Dom. 07/16/19. 10:04 AM.  I personally performed  the services described in this documentation, which was SCRIBED in my presence. The recorded information has been reviewed and considered accurate. It has been edited as necessary during review. [employer], MD

## 2019-08-14 DIAGNOSIS — R519 Headache, unspecified: Secondary | ICD-10-CM | POA: Diagnosis not present

## 2019-08-14 DIAGNOSIS — R110 Nausea: Secondary | ICD-10-CM | POA: Diagnosis not present

## 2019-10-18 ENCOUNTER — Ambulatory Visit
Admission: EM | Admit: 2019-10-18 | Discharge: 2019-10-18 | Disposition: A | Discharge status: Home / Self Care | Payer: BC Managed Care – PPO

## 2019-10-18 ENCOUNTER — Other Ambulatory Visit: Payer: Self-pay

## 2019-10-18 DIAGNOSIS — R0602 Shortness of breath: Secondary | ICD-10-CM

## 2019-10-18 DIAGNOSIS — R0789 Other chest pain: Secondary | ICD-10-CM

## 2019-10-18 NOTE — Discharge Instructions (Signed)
Unable to rule out cardiac disease or blood clot in urgent care setting.  Offered patient further evaluation and management in the ED.  Patient is agreeable at this time.  Will go by private vehicle to [facility] ed for further evaluation and management.

## 2019-10-18 NOTE — ED Provider Notes (Signed)
[facility]   [id number] 10/18/19 Arrival Time: 1212   CC: CHEST PAIN  SUBJECTIVE:  Vickie Lloyd is a 29 y.o. female who presents with complaint of chest pain that began 1 day ago.  Currently asymptomatic.  Denies a precipitating event, trauma, recent lower respiratory tract, or strenuous upper body activities.  Had second moderna vaccine 3 days ago.  Localizes chest pain to the substernal region.  Describes as worsening, that is intermittent (with episodes lasting 1-2 minutes at a time) and tightness in character.  Rates pain as 8/10 when present.   Has tried tylenol without relief.  Denies aggravating factors with activity/ exertion/ coughing.  Denies radiating symptoms.  Denies previous symptoms in the past.  Complains of associated fever of 102, headache, lightheadedness, nausea, and SOB.   Denies chills, dizziness, palpitations, tachycardia, vomiting, abdominal pain, changes in bowel or bladder habits, diaphoresis, numbness/tingling in extremities, peripheral edema, or anxiety.    Denies calf pain or swelling, recent long travel, recent surgery, pregnancy, malignancy, tobacco use, hormone use, or previous blood clot  ROS: As per HPI.  All other pertinent ROS negative.    Past Medical History:  Diagnosis Date  . Migraine    Past Surgical History:  Procedure Laterality Date  . birthmark removed as a child    . NO PAST SURGERIES     No Known Allergies No current facility-administered medications on file prior to encounter.   Current Outpatient Medications on File Prior to Encounter  Medication Sig Dispense Refill  . norgestimate-ethinyl estradiol (ORTHO-CYCLEN) 0.25-35 MG-MCG tablet Take 1 tablet by mouth daily. 1 Package 11   Social History   Socioeconomic History  . Marital status: Divorced    Spouse name: Not on file  . Number of children: 2  . Years of education: Not on file  . Highest education level: Not on file  Occupational History  . Not on file    Tobacco Use  . Smoking status: Never Smoker  . Smokeless tobacco: Never Used  Vaping Use  . Vaping Use: Never used  Substance and Sexual Activity  . Alcohol use: Yes    Comment: rarely  . Drug use: No  . Sexual activity: Yes    Birth control/protection: None  Other Topics Concern  . Not on file  Social History Narrative  . Not on file   Social Determinants of Health   Financial Resource Strain:   . Difficulty of Paying Living Expenses:   Food Insecurity:   . Worried About Programme researcher, broadcasting/film/video in the Last Year:   . Barista in the Last Year:   Transportation Needs:   . Freight forwarder (Medical):   Marland Kitchen Lack of Transportation (Non-Medical):   Physical Activity:   . Days of Exercise per Week:   . Minutes of Exercise per Session:   Stress:   . Feeling of Stress :   Social Connections:   . Frequency of Communication with Friends and Family:   . Frequency of Social Gatherings with Friends and Family:   . Attends Religious Services:   . Active Member of Clubs or Organizations:   . Attends Banker Meetings:   Marland Kitchen Marital Status:   Intimate Partner Violence:   . Fear of Current or Ex-Partner:   . Emotionally Abused:   Marland Kitchen Physically Abused:   . Sexually Abused:    Family History  Problem Relation Age of Onset  . Heart attack Paternal Grandfather   .  Stroke Maternal Grandmother   . Suicidality Maternal Grandfather   . Diabetes Father      OBJECTIVE:  Vitals:   10/18/19 1302  BP: 102/71  Pulse: 80  Resp: 18  Temp: 98.1 F (36.7 C)  SpO2: 98%    General appearance: alert; no distress Eyes: PERRLA; EOMI; conjunctiva normal HENT: normocephalic; atraumatic Neck: supple; no carotid bruits Lungs: clear to auscultation bilaterally without adventitious breath sounds Heart: regular rate and rhythm.  Clear S1 and S2 without rubs, gallops, or murmur. Abdomen: soft, non-tender; bowel sounds normal; no guarding Extremities: no cyanosis or edema;  symmetrical with no gross deformities Skin: warm and dry Psychological: alert and cooperative; normal mood and affect  ECG:  EKG normal sinus rhythm without ST elevations, depressions, or prolonged PR interval.  No narrowing or widening of the QRS complexes.    ASSESSMENT & PLAN:  1. Other chest pain   2. Shortness of breath    Unable to rule out cardiac disease or blood clot in urgent care setting.  Offered patient further evaluation and management in the ED.  Patient is agreeable at this time.  Will go by private vehicle to [facility] ed for further evaluation and management.     Rennis Harding, PA-C 10/18/19 1342

## 2019-10-18 NOTE — ED Triage Notes (Signed)
Pt presents with c/o sharp chest pain that has been intermittent since yesterday . Pt had second maderna vaccine on Friday .

## 2019-11-14 ENCOUNTER — Encounter: Payer: Self-pay | Admitting: Emergency Medicine

## 2019-11-14 ENCOUNTER — Ambulatory Visit
Admission: EM | Admit: 2019-11-14 | Discharge: 2019-11-14 | Disposition: A | Discharge status: Home / Self Care | Payer: BC Managed Care – PPO | Attending: Emergency Medicine | Admitting: Emergency Medicine

## 2019-11-14 ENCOUNTER — Other Ambulatory Visit: Payer: Self-pay

## 2019-11-14 DIAGNOSIS — Z1152 Encounter for screening for COVID-19: Secondary | ICD-10-CM | POA: Diagnosis not present

## 2019-11-14 DIAGNOSIS — J029 Acute pharyngitis, unspecified: Secondary | ICD-10-CM | POA: Insufficient documentation

## 2019-11-14 DIAGNOSIS — J069 Acute upper respiratory infection, unspecified: Secondary | ICD-10-CM | POA: Insufficient documentation

## 2019-11-14 DIAGNOSIS — R05 Cough: Secondary | ICD-10-CM

## 2019-11-14 LAB — POCT RAPID STREP A (OFFICE): Rapid Strep A Screen: NEGATIVE

## 2019-11-14 MED ORDER — PREDNISONE 10 MG PO TABS: 20.0000 mg | ORAL_TABLET | Freq: Every day | ORAL | 0 refills | Status: DC

## 2019-11-14 MED ORDER — FLUTICASONE PROPIONATE 50 MCG/ACT NA SUSP: 1.0000 | Freq: Every day | NASAL | 0 refills | Status: DC

## 2019-11-14 MED ORDER — BENZONATATE 100 MG PO CAPS: 100.0000 mg | ORAL_CAPSULE | Freq: Three times a day (TID) | ORAL | 0 refills | Status: DC

## 2019-11-14 MED ORDER — CEPACOL REGULAR STRENGTH 3 MG MT LOZG: 1.0000 | LOZENGE | OROMUCOSAL | 0 refills | Status: DC | PRN

## 2019-11-14 MED ORDER — CETIRIZINE HCL 10 MG PO TABS: 10.0000 mg | ORAL_TABLET | Freq: Every day | ORAL | 0 refills | Status: DC

## 2019-11-14 NOTE — ED Provider Notes (Signed)
[facility]   [id number] 11/14/19 Arrival Time: 2094  BS:JGGE THROAT  SUBJECTIVE: History from: patient.  Vickie Lloyd is a 29 y.o. female who presented to the urgent care for complaint of cough, nasal congestion, sore throat headache that started last night.  Denies sick exposure to COVID, flu or precipitating event.  Has tried OTC medication without relief.  Symptoms are made worse with swallowing, but tolerating liquids and own secretions without difficulty.  Denies previous symptoms in the past.     ROS: As per HPI.  All other pertinent ROS negative.      Past Medical History:  Diagnosis Date  . Migraine    Past Surgical History:  Procedure Laterality Date  . birthmark removed as a child    . NO PAST SURGERIES     No Known Allergies No current facility-administered medications on file prior to encounter.   Current Outpatient Medications on File Prior to Encounter  Medication Sig Dispense Refill  . norgestimate-ethinyl estradiol (ORTHO-CYCLEN) 0.25-35 MG-MCG tablet Take 1 tablet by mouth daily. 1 Package 11   Social History   Socioeconomic History  . Marital status: Divorced    Spouse name: Not on file  . Number of children: 2  . Years of education: Not on file  . Highest education level: Not on file  Occupational History  . Not on file  Tobacco Use  . Smoking status: Never Smoker  . Smokeless tobacco: Never Used  Vaping Use  . Vaping Use: Never used  Substance and Sexual Activity  . Alcohol use: Yes    Comment: rarely  . Drug use: No  . Sexual activity: Yes    Birth control/protection: None  Other Topics Concern  . Not on file  Social History Narrative  . Not on file   Social Determinants of Health   Financial Resource Strain:   . Difficulty of Paying Living Expenses: Not on file  Food Insecurity:   . Worried About Programme researcher, broadcasting/film/video in the Last Year: Not on file  . Ran Out of Food in the Last Year: Not on file  Transportation Needs:     . Lack of Transportation (Medical): Not on file  . Lack of Transportation (Non-Medical): Not on file  Physical Activity:   . Days of Exercise per Week: Not on file  . Minutes of Exercise per Session: Not on file  Stress:   . Feeling of Stress : Not on file  Social Connections:   . Frequency of Communication with Friends and Family: Not on file  . Frequency of Social Gatherings with Friends and Family: Not on file  . Attends Religious Services: Not on file  . Active Member of Clubs or Organizations: Not on file  . Attends Banker Meetings: Not on file  . Marital Status: Not on file  Intimate Partner Violence:   . Fear of Current or Ex-Partner: Not on file  . Emotionally Abused: Not on file  . Physically Abused: Not on file  . Sexually Abused: Not on file   Family History  Problem Relation Age of Onset  . Heart attack Paternal Grandfather   . Stroke Maternal Grandmother   . Suicidality Maternal Grandfather   . Diabetes Father     OBJECTIVE:  Vitals:   11/14/19 0917 11/14/19 0919  BP:  111/71  Pulse:  81  Resp:  17  Temp:  98.1 F (36.7 C)  TempSrc:  Oral  SpO2:  99%  Weight: 137  lb (62.1 kg)   Height: 5\' 5"  (1.651 m)      General appearance: alert; appears fatigued, but nontoxic, speaking in full sentences and managing own secretions HEENT: NCAT; Ears: EACs clear, TMs pearly gray with visible cone of light, without erythema; Eyes: PERRL, EOMI grossly; Nose: no obvious rhinorrhea; Throat: oropharynx clear, tonsils 1+ and mildly erythematous without white tonsillar exudates, uvula midline Neck: supple without LAD Lungs: CTA bilaterally without adventitious breath sounds; cough present Heart: regular rate and rhythm.  Radial pulses 2+ symmetrical bilaterally Skin: warm and dry Psychological: alert and cooperative; normal mood and affect  LABS: Results for orders placed or performed during the hospital encounter of 11/14/19 (from the past 24 hour(s))   POCT rapid strep A     Status: None   Collection Time: 11/14/19  9:30 AM  Result Value Ref Range   Rapid Strep A Screen Negative Negative     ASSESSMENT & PLAN:  1. Sore throat   2. Viral URI with cough   3. Encounter for screening for COVID-19     Meds ordered this encounter  Medications  . fluticasone (FLONASE) 50 MCG/ACT nasal spray    Sig: Place 1 spray into both nostrils daily for 14 days.    Dispense:  16 g    Refill:  0  . cetirizine (ZYRTEC ALLERGY) 10 MG tablet    Sig: Take 1 tablet (10 mg total) by mouth daily.    Dispense:  30 tablet    Refill:  0  . benzonatate (TESSALON) 100 MG capsule    Sig: Take 1 capsule (100 mg total) by mouth every 8 (eight) hours.    Dispense:  30 capsule    Refill:  0  . predniSONE (DELTASONE) 10 MG tablet    Sig: Take 2 tablets (20 mg total) by mouth daily.    Dispense:  15 tablet    Refill:  0  . menthol-cetylpyridinium (CEPACOL REGULAR STRENGTH) 3 MG lozenge    Sig: Take 1 lozenge (3 mg total) by mouth as needed for sore throat.    Dispense:  100 tablet    Refill:  0    Discharge instructions  Covid test will take 2 to 7 days for results to return.  Someone will call you if your result is abnormal  Strep test negative, will send out for culture and we will call you with results Declines test for mono at this time Get plenty of rest and push fluids Drink warm or cool liquids, use throat lozenges, or popsicles to help alleviate symptoms Take OTC ibuprofen or tylenol as needed for pain Follow up with PCP if symptoms persists Return or go to ER if patient has any new or worsening symptoms such as fever, chills, nausea, vomiting, worsening sore throat, cough, abdominal pain, chest pain, changes in bowel or bladder habits, etc...  Reviewed expectations re: course of current medical issues. Questions answered. Outlined signs and symptoms indicating need for more acute intervention. Patient verbalized understanding. After Visit  Summary given.      Note: This document was prepared using Dragon voice recognition software and may include unintentional dictation errors.    01/14/20, FNP 11/14/19 [phone]

## 2019-11-14 NOTE — Discharge Instructions (Addendum)
Covid test will take 2 to 7 days for results to return.  Someone will call you if your result is abnormal  Strep test negative, will send out for culture and we will call you with results Declines test for mono at this time Get plenty of rest and push fluids Drink warm or cool liquids, use throat lozenges, or popsicles to help alleviate symptoms Take OTC ibuprofen or tylenol as needed for pain Follow up with PCP if symptoms persists Return or go to ER if patient has any new or worsening symptoms such as fever, chills, nausea, vomiting, worsening sore throat, cough, abdominal pain, chest pain, changes in bowel or bladder habits, etc..Marland Kitchen

## 2019-11-14 NOTE — ED Triage Notes (Signed)
Sore throat, cough, congestion and headache that started last night. Son tested + for strep throat recently.

## 2019-11-15 LAB — SARS-COV-2, NAA 2 DAY TAT

## 2019-11-15 LAB — NOVEL CORONAVIRUS, NAA: SARS-CoV-2, NAA: NOT DETECTED

## 2019-11-17 LAB — CULTURE, GROUP A STREP (THRC): Special Requests: NORMAL

## 2020-01-14 DIAGNOSIS — Z20822 Contact with and (suspected) exposure to covid-19: Secondary | ICD-10-CM | POA: Diagnosis not present

## 2020-03-04 NOTE — L&D Delivery Note (Addendum)
[facility]N [facility] Delivery Note  Vickie Lloyd is a 30 y.o. [id number] s/p VD at [redacted]w[redacted]d. She was admitted for IOL for A1GDM.   ROM: 3h 70m with Clear, bloody fluid GBS Status: Positive/-- (10/25 0000) Maximum Maternal Temperature: 98.4  Labor Progress: Patient presented to L&D for IOL for A1GDM. Initial SVE: 1.5/30/-3. She received cervical ripening via foley balloon and Cytotec. She was AROM'd once in active labor. She then progressed to complete.   Delivery Date/Time: 01/13/21 @1005  Delivery: Called to room and patient was complete and pushing. Head delivered LOA. Tight nuchal cord around body, delivered through. Shoulder and body delivered in usual fashion. Infant with spontaneous cry, placed on mother's abdomen, dried and stimulated. Cord clamped x 2 after 1-minute delay, and cut by father of baby. Cord blood drawn. Placenta delivered spontaneously with gentle cord traction. Fundus firm with massage and Pitocin. Labia, perineum, vagina, and cervix inspected without lacerations. Placenta was examined after and found to be intact with normal 3-vessel cord.   Placenta: Intact 3VC Complications: None Lacerations: None EBL: 215mL Analgesia: Epidural  Postpartum Planning [X]  message to sent to schedule follow-up  [  ] postpartum MMR prior to discharge  Infant: APGARs 9/9  3170 g  Wells Guiles, DO 01/13/2021, 10:30 AM PGY-1, [facility]   I was gloved and present for entire delivery SVD without incident No difficulty with shoulders No lacerations  Postpartum appointment message sent by CNM.   Mallie Snooks, CNM 01/13/21 12:00 PM

## 2020-03-09 ENCOUNTER — Telehealth: Payer: BC Managed Care – PPO | Admitting: Emergency Medicine

## 2020-03-09 DIAGNOSIS — J029 Acute pharyngitis, unspecified: Secondary | ICD-10-CM

## 2020-03-09 NOTE — Progress Notes (Signed)
We are sorry that you are not feeling well.  Here is how we plan to help!  Your symptoms indicate a likely viral infection (Pharyngitis).   Pharyngitis is inflammation in the back of the throat which can cause a sore throat, scratchiness and sometimes difficulty swallowing.   Pharyngitis is typically caused by a respiratory virus and will just run its course.  Please keep in mind that your symptoms could last up to 10 days.  For throat pain, we recommend over the counter oral pain relief medications such as acetaminophen or aspirin, or anti-inflammatory medications such as ibuprofen or naproxen sodium.  Topical treatments such as oral throat lozenges or sprays may be used as needed.  Avoid close contact with loved ones, especially the very young and elderly.  Remember to wash your hands thoroughly throughout the day as this is the number one way to prevent the spread of infection and wipe down door knobs and counters with disinfectant.  If you have not been tested for COVID in the past few days, I'd recommend testing.  After careful review of your answers, I would not recommend and antibiotic for your condition.  Antibiotics should not be used to treat conditions that we suspect are caused by viruses like the virus that causes the common cold or flu. However, some people can have Strep with atypical symptoms. You may need formal testing in clinic or office to confirm if your symptoms continue or worsen.  Providers prescribe antibiotics to treat infections caused by bacteria. Antibiotics are very powerful in treating bacterial infections when they are used properly.  To maintain their effectiveness, they should be used only when necessary.  Overuse of antibiotics has resulted in the development of super bugs that are resistant to treatment!    Home Care:  Only take medications as instructed by your medical team.  Do not drink alcohol while taking these medications.  A steam or ultrasonic humidifier  can help congestion.  You can place a towel over your head and breathe in the steam from hot water coming from a faucet.  Avoid close contacts especially the very young and the elderly.  Cover your mouth when you cough or sneeze.  Always remember to wash your hands.  Get Help Right Away If:  You develop worsening fever or throat pain.  You develop a severe head ache or visual changes.  Your symptoms persist after you have completed your treatment plan.  Make sure you  Understand these instructions.  Will watch your condition.  Will get help right away if you are not doing well or get worse.  Your e-visit answers were reviewed by a board certified advanced clinical practitioner to complete your personal care plan.  Depending on the condition, your plan could have included both over the counter or prescription medications.  If there is a problem please reply  once you have received a response from your provider.  Your safety is important to [name].  If you have drug allergies check your prescription carefully.    You can use MyChart to ask questions about todays visit, request a non-urgent call back, or ask for a work or school excuse for 24 hours related to this e-Visit. If it has been greater than 24 hours you will need to follow up with your provider, or enter a new e-Visit to address those concerns.  You will get an e-mail in the next two days asking about your experience.  I hope that your e-visit has been  valuable and will speed your recovery. Thank you for using e-visits.   Approximately 5 minutes was used in reviewing the patient's chart, questionnaire, prescribing medications, and documentation.

## 2020-03-13 DIAGNOSIS — R059 Cough, unspecified: Secondary | ICD-10-CM | POA: Diagnosis not present

## 2020-03-13 DIAGNOSIS — R509 Fever, unspecified: Secondary | ICD-10-CM | POA: Diagnosis not present

## 2020-03-13 DIAGNOSIS — J029 Acute pharyngitis, unspecified: Secondary | ICD-10-CM | POA: Diagnosis not present

## 2020-03-13 DIAGNOSIS — R0981 Nasal congestion: Secondary | ICD-10-CM | POA: Diagnosis not present

## 2020-05-15 ENCOUNTER — Encounter: Payer: Self-pay | Admitting: *Deleted

## 2020-05-15 ENCOUNTER — Other Ambulatory Visit: Payer: Self-pay

## 2020-05-15 ENCOUNTER — Other Ambulatory Visit (INDEPENDENT_AMBULATORY_CARE_PROVIDER_SITE_OTHER): Payer: Self-pay | Admitting: *Deleted

## 2020-05-15 VITALS — BP 113/76 | HR 79 | Ht 65.0 in | Wt 151.0 lb

## 2020-05-15 DIAGNOSIS — Z3201 Encounter for pregnancy test, result positive: Secondary | ICD-10-CM

## 2020-05-15 LAB — POCT URINE PREGNANCY: Preg Test, Ur: POSITIVE — AB

## 2020-05-15 MED ORDER — PROMETHAZINE HCL 25 MG PO TABS: 25.0000 mg | ORAL_TABLET | Freq: Four times a day (QID) | ORAL | 1 refills | Status: DC | PRN

## 2020-05-15 MED ORDER — PRENATAL PLUS 27-1 MG PO TABS: 1.0000 | ORAL_TABLET | Freq: Every day | ORAL | 12 refills | Status: DC

## 2020-05-15 NOTE — Progress Notes (Signed)
Chart reviewed for nurse visit. Agree with plan of care. Will rx PNV and phenergan  Adline Potter, NP 05/15/2020 10:33 AM

## 2020-05-15 NOTE — Progress Notes (Signed)
   NURSE VISIT- PREGNANCY CONFIRMATION   SUBJECTIVE:  Vickie Lloyd is a 30 y.o. G49P2002 female at [redacted]w[redacted]d by certain LMP of Patient's last menstrual period was 04/08/2020. Here for pregnancy confirmation.  Home pregnancy test: positive x 3  She reports nausea.  She is not taking prenatal vitamins.    OBJECTIVE:  BP 113/76 (BP Location: Left Arm, Patient Position: Sitting, Cuff Size: Normal)   Pulse 79   Ht 5\' 5"  (1.651 m)   Wt 151 lb (68.5 kg)   LMP 04/08/2020   BMI 25.13 kg/m   Appears well, in no apparent distress OB History  Gravida Para Term Preterm AB Living  3 2 2  0 0 2  SAB IAB Ectopic Multiple Live Births  0 0 0 0 2    # Outcome Date GA Lbr Len/2nd Weight Sex Delivery Anes PTL Lv  3 Current           2 Term 12/02/10 [redacted]w[redacted]d 06:21 / 01:54 7 lb 9 oz (3.43 kg) F Vag-Spont EPI  LIV  1 Term     M Vag-Spont   LIV    Results for orders placed or performed in visit on 05/15/20 (from the past 24 hour(s))  POCT urine pregnancy   Collection Time: 05/15/20 10:16 AM  Result Value Ref Range   Preg Test, Ur Positive (A) Negative    ASSESSMENT: Positive pregnancy test, [redacted]w[redacted]d by LMP    PLAN: Schedule for dating ultrasound in 4 weeks Prenatal vitamins: note routed to [facility] to send prescription   Nausea medicines: requested-note routed to [facility] to send prescription   OB packet given: Yes  05/17/20  05/15/2020 10:25 AM

## 2020-05-15 NOTE — Addendum Note (Signed)
Addended by: Cyril Mourning A on: 05/15/2020 10:34 AM   Modules accepted: Orders

## 2020-06-09 ENCOUNTER — Other Ambulatory Visit: Payer: Self-pay | Admitting: Obstetrics & Gynecology

## 2020-06-09 DIAGNOSIS — O3680X0 Pregnancy with inconclusive fetal viability, not applicable or unspecified: Secondary | ICD-10-CM

## 2020-06-12 ENCOUNTER — Ambulatory Visit (INDEPENDENT_AMBULATORY_CARE_PROVIDER_SITE_OTHER): Payer: Self-pay

## 2020-06-12 ENCOUNTER — Other Ambulatory Visit: Payer: Self-pay

## 2020-06-12 DIAGNOSIS — O3680X0 Pregnancy with inconclusive fetal viability, not applicable or unspecified: Secondary | ICD-10-CM

## 2020-06-12 NOTE — Progress Notes (Signed)
[name] 9+2 wks,single IUP with YS,positive fht 169 bpm,normal ovaries,crl 24.35 mm

## 2020-07-11 ENCOUNTER — Other Ambulatory Visit: Payer: Self-pay | Admitting: Obstetrics & Gynecology

## 2020-07-11 DIAGNOSIS — Z3682 Encounter for antenatal screening for nuchal translucency: Secondary | ICD-10-CM

## 2020-07-12 ENCOUNTER — Encounter: Payer: Self-pay | Admitting: Women's Health

## 2020-07-12 ENCOUNTER — Ambulatory Visit (INDEPENDENT_AMBULATORY_CARE_PROVIDER_SITE_OTHER): Payer: Medicaid Other

## 2020-07-12 ENCOUNTER — Ambulatory Visit (INDEPENDENT_AMBULATORY_CARE_PROVIDER_SITE_OTHER): Payer: Medicaid Other | Admitting: Women's Health

## 2020-07-12 ENCOUNTER — Ambulatory Visit: Payer: Medicaid Other | Admitting: *Deleted

## 2020-07-12 ENCOUNTER — Other Ambulatory Visit: Payer: Self-pay

## 2020-07-12 VITALS — BP 100/68 | HR 78 | Wt 152.0 lb

## 2020-07-12 DIAGNOSIS — O0990 Supervision of high risk pregnancy, unspecified, unspecified trimester: Secondary | ICD-10-CM | POA: Insufficient documentation

## 2020-07-12 DIAGNOSIS — Z3A13 13 weeks gestation of pregnancy: Secondary | ICD-10-CM | POA: Diagnosis not present

## 2020-07-12 DIAGNOSIS — Z3481 Encounter for supervision of other normal pregnancy, first trimester: Secondary | ICD-10-CM | POA: Diagnosis not present

## 2020-07-12 DIAGNOSIS — Z3490 Encounter for supervision of normal pregnancy, unspecified, unspecified trimester: Secondary | ICD-10-CM | POA: Insufficient documentation

## 2020-07-12 DIAGNOSIS — Z3682 Encounter for antenatal screening for nuchal translucency: Secondary | ICD-10-CM

## 2020-07-12 DIAGNOSIS — Z3480 Encounter for supervision of other normal pregnancy, unspecified trimester: Secondary | ICD-10-CM

## 2020-07-12 LAB — POCT URINALYSIS DIPSTICK OB
Blood, UA: NEGATIVE
Glucose, UA: NEGATIVE
Ketones, UA: NEGATIVE
Leukocytes, UA: NEGATIVE
Nitrite, UA: NEGATIVE
POC,PROTEIN,UA: NEGATIVE

## 2020-07-12 NOTE — Progress Notes (Signed)
[name] 13+4 wks,measurements c/w dates,CRL 71.70 mm,NB present,NT 1.6 mm,normal left ovary,right ovary not visualized,fhr 158 bpm,posterior placenta

## 2020-07-12 NOTE — Progress Notes (Signed)
INITIAL OBSTETRICAL VISIT Patient name: Vickie Lloyd MRN [id number]  Date of birth: 23-Mar-1990 Chief Complaint:   Initial Prenatal Visit  History of Present Illness:   Vickie Lloyd is a 30 y.o. G26P2002 Caucasian female at [redacted]w[redacted]d by LMP c/w u/s at 9 weeks with an Estimated Date of Delivery: 01/13/21 being seen today for her initial obstetrical visit.   Her obstetrical history is significant for term uncomplicated SVB x 2.   Today she reports sweet tea tastes really sweet, unable to drink sodas- concerned about DM. No family h/o DM.Marland Kitchen Discussed these changes can be normal during pregnancy.  Depression screen [facility] 2/9 07/12/2020 09/30/2017 08/06/2017 11/18/2016 01/19/2016  Decreased Interest 0 0 0 0 0  Down, Depressed, Hopeless 0 0 0 0 0  PHQ - 2 Score 0 0 0 0 0  Altered sleeping 0 - - - -  Tired, decreased energy 0 - - - -  Change in appetite 0 - - - -  Feeling bad or failure about yourself  0 - - - -  Trouble concentrating 0 - - - -  Moving slowly or fidgety/restless 0 - - - -  Suicidal thoughts 0 - - - -  PHQ-9 Score 0 - - - -    Patient's last menstrual period was 04/08/2020. Last pap 11/18/16. Results were: NILM w/ HRHPV not done, declines today, wants to do next visit Review of Systems:   Pertinent items are noted in HPI Denies cramping/contractions, leakage of fluid, vaginal bleeding, abnormal vaginal discharge w/ itching/odor/irritation, headaches, visual changes, shortness of breath, chest pain, abdominal pain, severe nausea/vomiting, or problems with urination or bowel movements unless otherwise stated above.  Pertinent History Reviewed:  Reviewed past medical,surgical, social, obstetrical and family history.  Reviewed problem list, medications and allergies. OB History  Gravida Para Term Preterm AB Living  3 2 2  0 0 2  SAB IAB Ectopic Multiple Live Births  0 0 0 0 2    # Outcome Date GA Lbr Len/2nd Weight Sex Delivery Anes PTL Lv  3 Current           2 Term 12/02/10 [redacted]w[redacted]d  06:21 / 01:54 7 lb 9 oz (3.43 kg) F Vag-Spont EPI  LIV  1 Term 03/20/07 [redacted]w[redacted]d  7 lb 5 oz (3.317 kg) M Vag-Spont EPI N LIV   Physical Assessment:   Vitals:   07/12/20 0946  BP: 100/68  Pulse: 78  Weight: 152 lb (68.9 kg)  Body mass index is 25.29 kg/m.       Physical Examination:  General appearance - well appearing, and in no distress  Mental status - alert, oriented to person, place, and time  Psych:  She has a normal mood and affect  Skin - warm and dry, normal color, no suspicious lesions noted  Chest - effort normal, all lung fields clear to auscultation bilaterally  Heart - normal rate and regular rhythm  Abdomen - soft, nontender  Extremities:  No swelling or varicosities noted  Pelvic - VULVA: normal appearing vulva with no masses, tenderness or lesions  VAGINA: normal appearing vagina with normal color and discharge, no lesions  CERVIX: normal appearing cervix without discharge or lesions, no CMT  Thin prep pap is not done-declines today, wants next visit  Chaperone: N/A    TODAY'S NT 09/11/20 13+4 wks,measurements c/w dates,CRL 71.70 mm,NB present,NT 1.6 mm,normal left ovary,right ovary not visualized,fhr 158 bpm,posterior placenta  Results for orders placed or performed in visit on 07/12/20 (from  the past 24 hour(s))  POC Urinalysis Dipstick OB   Collection Time: 07/12/20 10:23 AM  Result Value Ref Range   Color, UA     Clarity, UA     Glucose, UA Negative Negative   Bilirubin, UA     Ketones, UA neg    Spec Grav, UA     Blood, UA neg    pH, UA     POC,PROTEIN,UA Negative Negative, Trace, Small (1+), Moderate (2+), Large (3+), 4+   Urobilinogen, UA     Nitrite, UA neg    Leukocytes, UA Negative Negative   Appearance     Odor      Assessment & Plan:  1) Low-Risk Pregnancy G3P2002 at [redacted]w[redacted]d with an Estimated Date of Delivery: 01/13/21   2) Initial OB visit  Meds: No orders of the defined types were placed in this encounter.   Initial labs obtained Continue  prenatal vitamins Reviewed n/v relief measures and warning s/s to report Reviewed recommended weight gain based on pre-gravid BMI Encouraged well-balanced diet Genetic & carrier screening discussed: requests Panorama, NT/IT and Horizon 14  Ultrasound discussed; fetal survey: requested CCNC completed> form faxed if has or is planning to apply for medicaid The nature of [facility] [employer] with multiple MDs and other Advanced Practice Providers was explained to patient; also emphasized that fellows, residents, and students are part of our team. Does have home bp cuff. Office bp cuff given: no. Check bp weekly, let us know if consistently >140/90.   Follow-up: Return in about 3 weeks (around 08/02/2020) for LROB, 2nd IT, pap , CNM, in person.   Orders Placed This Encounter  Procedures  . Urine Culture  . GC/Chlamydia Probe Amp  . Integrated 1  . Genetic Screening  . Pain Management Screening Profile (10S)  . Obstetric Panel, Including HIV  . Hepatitis C antibody  . POC Urinalysis Dipstick OB    Cheral Marker CNM, [facility] 07/12/2020 10:35 AM

## 2020-07-12 NOTE — Patient Instructions (Signed)
Vickie Lloyd, I greatly value your feedback.  If you receive a survey following your visit with [name] today, we appreciate you taking the time to fill it out.  Thanks, Joellyn Haff, CNM, WHNP-BC   [facility] ([address] [place], [place] [address]) Entrance C, located off of [address] Free 24/7 valet parking   Nausea & Vomiting  Have saltine crackers or pretzels by your bed and eat a few bites before you raise your head out of bed in the morning  Eat small frequent meals throughout the day instead of large meals  Drink plenty of fluids throughout the day to stay hydrated, just don't drink a lot of fluids with your meals.  This can make your stomach fill up faster making you feel sick  Do not brush your teeth right after you eat  Products with real ginger are good for nausea, like ginger ale and ginger hard candy Make sure it says made with real ginger!  Sucking on sour candy like lemon heads is also good for nausea  If your prenatal vitamins make you nauseated, take them at night so you will sleep through the nausea  Sea Bands  If you feel like you need medicine for the nausea & vomiting please let us know  If you are unable to keep any fluids or food down please let us know   Constipation  Drink plenty of fluid, preferably water, throughout the day  Eat foods high in fiber such as fruits, vegetables, and grains  Exercise, such as walking, is a good way to keep your bowels regular  Drink warm fluids, especially warm prune juice, or decaf coffee  Eat a 1/2 cup of real oatmeal (not instant), 1/2 cup applesauce, and 1/2-1 cup warm prune juice every day  If needed, you may take Colace (docusate sodium) stool softener once or twice a day to help keep the stool soft.   If you still are having problems with constipation, you may take Miralax once daily as needed to help keep your bowels regular.   Home Blood Pressure Monitoring for Patients    Your provider has recommended that you check your blood pressure (BP) at least once a week at home. If you do not have a blood pressure cuff at home, one will be provided for you. Contact your provider if you have not received your monitor within 1 week.   Helpful Tips for Accurate Home Blood Pressure Checks  . Don't smoke, exercise, or drink caffeine 30 minutes before checking your BP . Use the restroom before checking your BP (a full bladder can raise your pressure) . Relax in a comfortable upright chair . Feet on the ground . Left arm resting comfortably on a flat surface at the level of your heart . Legs uncrossed . Back supported . Sit quietly and don't talk . Place the cuff on your bare arm . Adjust snuggly, so that only two fingertips can fit between your skin and the top of the cuff . Check 2 readings separated by at least one minute . Keep a log of your BP readings . For a visual, please reference this diagram: http://ccnc.care/bpdiagram  Provider Name: [facility]     Phone: [phone]  Zone 1: ALL CLEAR  Continue to monitor your symptoms:  . BP reading is less than 140 (top number) or less than 90 (bottom number)  . No right upper stomach pain . No headaches or  seeing spots . No feeling nauseated or throwing up . No swelling in face and hands  Zone 2: CAUTION Call your doctor's office for any of the following:  . BP reading is greater than 140 (top number) or greater than 90 (bottom number)  . Stomach pain under your ribs in the middle or right side . Headaches or seeing spots . Feeling nauseated or throwing up . Swelling in face and hands  Zone 3: EMERGENCY  Seek immediate medical care if you have any of the following:  . BP reading is greater than160 (top number) or greater than 110 (bottom number) . Severe headaches not improving with Tylenol . Serious difficulty catching your breath . Any worsening symptoms from Zone 2    First Trimester of  Pregnancy The first trimester of pregnancy is from week 1 until the end of week 12 (months 1 through 3). A week after a sperm fertilizes an egg, the egg will implant on the wall of the uterus. This embryo will begin to develop into a baby. Genes from you and your partner are forming the baby. The female genes determine whether the baby is a boy or a girl. At 6-8 weeks, the eyes and face are formed, and the heartbeat can be seen on ultrasound. At the end of 12 weeks, all the baby's organs are formed.  Now that you are pregnant, you will want to do everything you can to have a healthy baby. Two of the most important things are to get good prenatal care and to follow your health care provider's instructions. Prenatal care is all the medical care you receive before the baby's birth. This care will help prevent, find, and treat any problems during the pregnancy and childbirth. BODY CHANGES Your body goes through many changes during pregnancy. The changes vary from woman to woman.   You may gain or lose a couple of pounds at first.  You may feel sick to your stomach (nauseous) and throw up (vomit). If the vomiting is uncontrollable, call your health care provider.  You may tire easily.  You may develop headaches that can be relieved by medicines approved by your health care provider.  You may urinate more often. Painful urination may mean you have a bladder infection.  You may develop heartburn as a result of your pregnancy.  You may develop constipation because certain hormones are causing the muscles that push waste through your intestines to slow down.  You may develop hemorrhoids or swollen, bulging veins (varicose veins).  Your breasts may begin to grow larger and become tender. Your nipples may stick out more, and the tissue that surrounds them (areola) may become darker.  Your gums may bleed and may be sensitive to brushing and flossing.  Dark spots or blotches (chloasma, mask of pregnancy)  may develop on your face. This will likely fade after the baby is born.  Your menstrual periods will stop.  You may have a loss of appetite.  You may develop cravings for certain kinds of food.  You may have changes in your emotions from day to day, such as being excited to be pregnant or being concerned that something may go wrong with the pregnancy and baby.  You may have more vivid and strange dreams.  You may have changes in your hair. These can include thickening of your hair, rapid growth, and changes in texture. Some women also have hair loss during or after pregnancy, or hair that feels dry or thin. Your  hair will most likely return to normal after your baby is born. WHAT TO EXPECT AT YOUR PRENATAL VISITS During a routine prenatal visit:  You will be weighed to make sure you and the baby are growing normally.  Your blood pressure will be taken.  Your abdomen will be measured to track your baby's growth.  The fetal heartbeat will be listened to starting around week 10 or 12 of your pregnancy.  Test results from any previous visits will be discussed. Your health care provider may ask you:  How you are feeling.  If you are feeling the baby move.  If you have had any abnormal symptoms, such as leaking fluid, bleeding, severe headaches, or abdominal cramping.  If you have any questions. Other tests that may be performed during your first trimester include:  Blood tests to find your blood type and to check for the presence of any previous infections. They will also be used to check for low iron levels (anemia) and Rh antibodies. Later in the pregnancy, blood tests for diabetes will be done along with other tests if problems develop.  Urine tests to check for infections, diabetes, or protein in the urine.  An ultrasound to confirm the proper growth and development of the baby.  An amniocentesis to check for possible genetic problems.  Fetal screens for spina bifida and  Down syndrome.  You may need other tests to make sure you and the baby are doing well. HOME CARE INSTRUCTIONS  Medicines  Follow your health care provider's instructions regarding medicine use. Specific medicines may be either safe or unsafe to take during pregnancy.  Take your prenatal vitamins as directed.  If you develop constipation, try taking a stool softener if your health care provider approves. Diet  Eat regular, well-balanced meals. Choose a variety of foods, such as meat or vegetable-based protein, fish, milk and low-fat dairy products, vegetables, fruits, and whole grain breads and cereals. Your health care provider will help you determine the amount of weight gain that is right for you.  Avoid raw meat and uncooked cheese. These carry germs that can cause birth defects in the baby.  Eating four or five small meals rather than three large meals a day may help relieve nausea and vomiting. If you start to feel nauseous, eating a few soda crackers can be helpful. Drinking liquids between meals instead of during meals also seems to help nausea and vomiting.  If you develop constipation, eat more high-fiber foods, such as fresh vegetables or fruit and whole grains. Drink enough fluids to keep your urine clear or pale yellow. Activity and Exercise  Exercise only as directed by your health care provider. Exercising will help you:  Control your weight.  Stay in shape.  Be prepared for labor and delivery.  Experiencing pain or cramping in the lower abdomen or low back is a good sign that you should stop exercising. Check with your health care provider before continuing normal exercises.  Try to avoid standing for long periods of time. Move your legs often if you must stand in one place for a long time.  Avoid heavy lifting.  Wear low-heeled shoes, and practice good posture.  You may continue to have sex unless your health care provider directs you otherwise. Relief of Pain  or Discomfort  Wear a good support bra for breast tenderness.    Take warm sitz baths to soothe any pain or discomfort caused by hemorrhoids. Use hemorrhoid cream if your health care provider  approves.    Rest with your legs elevated if you have leg cramps or low back pain.  If you develop varicose veins in your legs, wear support hose. Elevate your feet for 15 minutes, 3-4 times a day. Limit salt in your diet. Prenatal Care  Schedule your prenatal visits by the twelfth week of pregnancy. They are usually scheduled monthly at first, then more often in the last 2 months before delivery.  Write down your questions. Take them to your prenatal visits.  Keep all your prenatal visits as directed by your health care provider. Safety  Wear your seat belt at all times when driving.  Make a list of emergency phone numbers, including numbers for family, friends, the hospital, and police and fire departments. General Tips  Ask your health care provider for a referral to a local prenatal education class. Begin classes no later than at the beginning of month 6 of your pregnancy.  Ask for help if you have counseling or nutritional needs during pregnancy. Your health care provider can offer advice or refer you to specialists for help with various needs.  Do not use hot tubs, steam rooms, or saunas.  Do not douche or use tampons or scented sanitary pads.  Do not cross your legs for long periods of time.  Avoid cat litter boxes and soil used by cats. These carry germs that can cause birth defects in the baby and possibly loss of the fetus by miscarriage or stillbirth.  Avoid all smoking, herbs, alcohol, and medicines not prescribed by your health care provider. Chemicals in these affect the formation and growth of the baby.  Schedule a dentist appointment. At home, brush your teeth with a soft toothbrush and be gentle when you floss. SEEK MEDICAL CARE IF:   You have dizziness.  You have mild  pelvic cramps, pelvic pressure, or nagging pain in the abdominal area.  You have persistent nausea, vomiting, or diarrhea.  You have a bad smelling vaginal discharge.  You have pain with urination.  You notice increased swelling in your face, hands, legs, or ankles. SEEK IMMEDIATE MEDICAL CARE IF:   You have a fever.  You are leaking fluid from your vagina.  You have spotting or bleeding from your vagina.  You have severe abdominal cramping or pain.  You have rapid weight gain or loss.  You vomit blood or material that looks like coffee grounds.  You are exposed to [name] measles and have never had them.  You are exposed to fifth disease or chickenpox.  You develop a severe headache.  You have shortness of breath.  You have any kind of trauma, such as from a fall or a car accident. Document Released: 02/12/2001 Document Revised: 07/05/2013 Document Reviewed: 12/29/2012 [facility] Patient Information 2015 [place], [place]. This information is not intended to replace advice given to you by your health care provider. Make sure you discuss any questions you have with your health care provider.

## 2020-07-13 ENCOUNTER — Encounter: Payer: Self-pay | Admitting: Women's Health

## 2020-07-13 DIAGNOSIS — Z2839 Other underimmunization status: Secondary | ICD-10-CM | POA: Insufficient documentation

## 2020-07-13 LAB — PMP SCREEN PROFILE (10S), URINE
Amphetamine Scrn, Ur: NEGATIVE ng/mL
BARBITURATE SCREEN URINE: NEGATIVE ng/mL
BENZODIAZEPINE SCREEN, URINE: NEGATIVE ng/mL
CANNABINOIDS UR QL SCN: NEGATIVE ng/mL
Cocaine (Metab) Scrn, Ur: NEGATIVE ng/mL
Creatinine(Crt), U: 224.6 mg/dL (ref 20.0–300.0)
Methadone Screen, Urine: NEGATIVE ng/mL
OXYCODONE+OXYMORPHONE UR QL SCN: NEGATIVE ng/mL
Opiate Scrn, Ur: NEGATIVE ng/mL
Ph of Urine: 5.8 (ref 4.5–8.9)
Phencyclidine Qn, Ur: NEGATIVE ng/mL
Propoxyphene Scrn, Ur: NEGATIVE ng/mL

## 2020-07-14 LAB — GC/CHLAMYDIA PROBE AMP
Chlamydia trachomatis, NAA: NEGATIVE
Neisseria Gonorrhoeae by PCR: NEGATIVE

## 2020-07-15 LAB — URINE CULTURE

## 2020-07-17 LAB — OBSTETRIC PANEL, INCLUDING HIV
Antibody Screen: NEGATIVE
Basophils Absolute: 0.1 10*3/uL (ref 0.0–0.2)
Basos: 1 %
EOS (ABSOLUTE): 0.1 10*3/uL (ref 0.0–0.4)
Eos: 1 %
HIV Screen 4th Generation wRfx: NONREACTIVE
Hematocrit: 37.1 % (ref 34.0–46.6)
Hemoglobin: 12.6 g/dL (ref 11.1–15.9)
Hepatitis B Surface Ag: NEGATIVE
Immature Grans (Abs): 0.1 10*3/uL (ref 0.0–0.1)
Immature Granulocytes: 1 %
Lymphocytes Absolute: 1.9 10*3/uL (ref 0.7–3.1)
Lymphs: 20 %
MCH: 29 pg (ref 26.6–33.0)
MCHC: 34 g/dL (ref 31.5–35.7)
MCV: 86 fL (ref 79–97)
Monocytes Absolute: 0.6 10*3/uL (ref 0.1–0.9)
Monocytes: 6 %
Neutrophils Absolute: 7 10*3/uL (ref 1.4–7.0)
Neutrophils: 71 %
Platelets: 256 10*3/uL (ref 150–450)
RBC: 4.34 x10E6/uL (ref 3.77–5.28)
RDW: 13.3 % (ref 11.7–15.4)
RPR Ser Ql: NONREACTIVE
Rh Factor: POSITIVE
Rubella Antibodies, IGG: <0.9 index — ABNORMAL LOW (ref >0.99)
WBC: 9.7 10*3/uL (ref 3.4–10.8)

## 2020-07-17 LAB — INTEGRATED 1
Crown Rump Length: 71.8 mm
Gest. Age on Collection Date: 13.1 weeks
Maternal Age at EDD: 30.5 yr
Nuchal Translucency (NT): 1.6 mm
Number of Fetuses: 1
PAPP-A Value: 2987.8 ng/mL
Weight: 152 [lb_av]

## 2020-07-17 LAB — HEPATITIS C ANTIBODY: Hep C Virus Ab: <0.1 s/co ratio (ref 0.0–0.9)

## 2020-08-03 ENCOUNTER — Other Ambulatory Visit: Payer: Self-pay

## 2020-08-03 ENCOUNTER — Other Ambulatory Visit (HOSPITAL_COMMUNITY)
Admission: RE | Admit: 2020-08-03 | Discharge: 2020-08-03 | Disposition: A | Discharge status: Home / Self Care | Payer: Medicaid Other | Source: Ambulatory Visit | Attending: Advanced Practice Midwife | Admitting: Advanced Practice Midwife

## 2020-08-03 ENCOUNTER — Ambulatory Visit (INDEPENDENT_AMBULATORY_CARE_PROVIDER_SITE_OTHER): Payer: Medicaid Other | Admitting: Advanced Practice Midwife

## 2020-08-03 ENCOUNTER — Encounter: Payer: Self-pay | Admitting: Advanced Practice Midwife

## 2020-08-03 VITALS — BP 96/66 | HR 76 | Wt 154.0 lb

## 2020-08-03 DIAGNOSIS — Z124 Encounter for screening for malignant neoplasm of cervix: Secondary | ICD-10-CM | POA: Diagnosis not present

## 2020-08-03 DIAGNOSIS — Z3480 Encounter for supervision of other normal pregnancy, unspecified trimester: Secondary | ICD-10-CM

## 2020-08-03 DIAGNOSIS — Z1379 Encounter for other screening for genetic and chromosomal anomalies: Secondary | ICD-10-CM

## 2020-08-03 DIAGNOSIS — Z3482 Encounter for supervision of other normal pregnancy, second trimester: Secondary | ICD-10-CM

## 2020-08-03 DIAGNOSIS — Z3A16 16 weeks gestation of pregnancy: Secondary | ICD-10-CM

## 2020-08-03 MED ORDER — TERCONAZOLE 0.4 % VA CREA: 1.0000 | TOPICAL_CREAM | Freq: Every day | VAGINAL | 0 refills | Status: DC

## 2020-08-03 NOTE — Progress Notes (Signed)
   LOW-RISK PREGNANCY VISIT Patient name: Vickie Lloyd MRN [id number]  Date of birth: [birth year] Chief Complaint:   Routine Prenatal Visit (Pap/ 2nd IT)  History of Present Illness:   Vickie Lloyd is a 30 y.o. G43P2002 female at [redacted]w[redacted]d with an Estimated Date of Delivery: 01/13/21 being seen today for ongoing management of a low-risk pregnancy.  Today she reports feeling better than she did in her first trimeseter. Contractions: Not present.  .  Movement: Absent. denies leaking of fluid. C/O vaginal irritation for a few days, no change in discharge or fishy odor.  Review of Systems:   Pertinent items are noted in HPI Denies abnormal vaginal discharge w/ itching/odor/irritation, headaches, visual changes, shortness of breath, chest pain, abdominal pain, severe nausea/vomiting, or problems with urination or bowel movements unless otherwise stated above. Pertinent History Reviewed:  Reviewed past medical,surgical, social, obstetrical and family history.  Reviewed problem list, medications and allergies. Physical Assessment:   Vitals:   08/03/20 1056  BP: 96/66  Pulse: 76  Weight: 154 lb (69.9 kg)  Body mass index is 25.63 kg/m.        Physical Examination:   General appearance: Well appearing, and in no distress  Mental status: Alert, oriented to person, place, and time  Skin: Warm & dry  Cardiovascular: Normal heart rate noted  Respiratory: Normal respiratory effort, no distress  Abdomen: Soft, gravid, nontender  Pelvic: SSE:  Pap collected'  Thin frothy dischargte.  Wet prep negative clue, + yeast and WBC. UA neg         Extremities: Edema: None  Fetal Status:     Movement: Absent    Chaperone: Amanda Rash    No results found for this or any previous visit (from the past 24 hour(s)).  Assessment & Plan:  1) Low-risk pregnancy G3P2002 at [redacted]w[redacted]d with an Estimated Date of Delivery: 01/13/21  2)  Vag irritaiton/yeast on wet prep  Rx terconazole    Meds: No orders of the defined  types were placed in this encounter.  Labs/procedures today: Pap  Plan:  Continue routine obstetrical care  Next visit: prefers will be in person for [name]    Reviewed: Preterm labor symptoms and general obstetric precautions including but not limited to vaginal bleeding, contractions, leaking of fluid and fetal movement were reviewed in detail with the patient.  All questions were answered. Has home bp cuff. Check bp weekly, let us know if >140/90.   Follow-up: Return in about 2 weeks (around 08/17/2020) for OV:FIEPPIR, LROB.  Orders Placed This Encounter  Procedures  . [facility] 2   Jacklyn Shell DNP, CNM 08/03/2020 11:13 AM

## 2020-08-03 NOTE — Patient Instructions (Signed)
[name], I greatly value your feedback.  If you receive a survey following your visit with [name] today, we appreciate you taking the time to fill it out.  Thanks, Cathie Beams, CNM     [facility] HAS MOVED!!! It is now [facility] at [facility] ([address] [place], [place] [address]) Entrance located off of [address] Free 24/7 valet parking   Go to [email or web address] to register for FREE online childbirth classes    Second Trimester of Pregnancy The second trimester is from week 14 through week 27 (months 4 through 6). The second trimester is often a time when you feel your best. Your body has adjusted to being pregnant, and you begin to feel better physically. Usually, morning sickness has lessened or quit completely, you may have more energy, and you may have an increase in appetite. The second trimester is also a time when the fetus is growing rapidly. At the end of the sixth month, the fetus is about 9 inches long and weighs about 1 pounds. You will likely begin to feel the baby move (quickening) between 16 and 20 weeks of pregnancy. Body changes during your second trimester Your body continues to go through many changes during your second trimester. The changes vary from woman to woman.  Your weight will continue to increase. You will notice your lower abdomen bulging out.  You may begin to get stretch marks on your hips, abdomen, and breasts.  You may develop headaches that can be relieved by medicines. The medicines should be approved by your health care provider.  You may urinate more often because the fetus is pressing on your bladder.  You may develop or continue to have heartburn as a result of your pregnancy.  You may develop constipation because certain hormones are causing the muscles that push waste through your intestines to slow down.  You may develop hemorrhoids or swollen, bulging veins (varicose veins).  You may have back  pain. This is caused by: ? Weight gain. ? Pregnancy hormones that are relaxing the joints in your pelvis. ? A shift in weight and the muscles that support your balance.  Your breasts will continue to grow and they will continue to become tender.  Your gums may bleed and may be sensitive to brushing and flossing.  Dark spots or blotches (chloasma, mask of pregnancy) may develop on your face. This will likely fade after the baby is born.  A dark line from your belly button to the pubic area (linea nigra) may appear. This will likely fade after the baby is born.  You may have changes in your hair. These can include thickening of your hair, rapid growth, and changes in texture. Some women also have hair loss during or after pregnancy, or hair that feels dry or thin. Your hair will most likely return to normal after your baby is born.  What to expect at prenatal visits During a routine prenatal visit:  You will be weighed to make sure you and the fetus are growing normally.  Your blood pressure will be taken.  Your abdomen will be measured to track your baby's growth.  The fetal heartbeat will be listened to.  Any test results from the previous visit will be discussed.  Your health care provider may ask you:  How you are feeling.  If you are feeling the baby move.  If you have had any abnormal symptoms, such as leaking fluid, bleeding, severe headaches, or  abdominal cramping.  If you are using any tobacco products, including cigarettes, chewing tobacco, and electronic cigarettes.  If you have any questions.  Other tests that may be performed during your second trimester include:  Blood tests that check for: ? Low iron levels (anemia). ? High blood sugar that affects pregnant women (gestational diabetes) between 10 and 28 weeks. ? Rh antibodies. This is to check for a protein on red blood cells (Rh factor).  Urine tests to check for infections, diabetes, or protein in the  urine.  An ultrasound to confirm the proper growth and development of the baby.  An amniocentesis to check for possible genetic problems.  Fetal screens for spina bifida and Down syndrome.  HIV (human immunodeficiency virus) testing. Routine prenatal testing includes screening for HIV, unless you choose not to have this test.  Follow these instructions at home: Medicines  Follow your health care provider's instructions regarding medicine use. Specific medicines may be either safe or unsafe to take during pregnancy.  Take a prenatal vitamin that contains at least 600 micrograms (mcg) of folic acid.  If you develop constipation, try taking a stool softener if your health care provider approves. Eating and drinking  Eat a balanced diet that includes fresh fruits and vegetables, whole grains, good sources of protein such as meat, eggs, or tofu, and low-fat dairy. Your health care provider will help you determine the amount of weight gain that is right for you.  Avoid raw meat and uncooked cheese. These carry germs that can cause birth defects in the baby.  If you have low calcium intake from food, talk to your health care provider about whether you should take a daily calcium supplement.  Limit foods that are high in fat and processed sugars, such as fried and sweet foods.  To prevent constipation: ? Drink enough fluid to keep your urine clear or pale yellow. ? Eat foods that are high in fiber, such as fresh fruits and vegetables, whole grains, and beans. Activity  Exercise only as directed by your health care provider. Most women can continue their usual exercise routine during pregnancy. Try to exercise for 30 minutes at least 5 days a week. Stop exercising if you experience uterine contractions.  Avoid heavy lifting, wear low heel shoes, and practice good posture.  A sexual relationship may be continued unless your health care provider directs you otherwise. Relieving pain and  discomfort  Wear a good support bra to prevent discomfort from breast tenderness.  Take warm sitz baths to soothe any pain or discomfort caused by hemorrhoids. Use hemorrhoid cream if your health care provider approves.  Rest with your legs elevated if you have leg cramps or low back pain.  If you develop varicose veins, wear support hose. Elevate your feet for 15 minutes, 3-4 times a day. Limit salt in your diet. Prenatal Care  Write down your questions. Take them to your prenatal visits.  Keep all your prenatal visits as told by your health care provider. This is important. Safety  Wear your seat belt at all times when driving.  Make a list of emergency phone numbers, including numbers for family, friends, the hospital, and police and fire departments. General instructions  Ask your health care provider for a referral to a local prenatal education class. Begin classes no later than the beginning of month 6 of your pregnancy.  Ask for help if you have counseling or nutritional needs during pregnancy. Your health care provider can offer advice or  refer you to specialists for help with various needs.  Do not use hot tubs, steam rooms, or saunas.  Do not douche or use tampons or scented sanitary pads.  Do not cross your legs for long periods of time.  Avoid cat litter boxes and soil used by cats. These carry germs that can cause birth defects in the baby and possibly loss of the fetus by miscarriage or stillbirth.  Avoid all smoking, herbs, alcohol, and unprescribed drugs. Chemicals in these products can affect the formation and growth of the baby.  Do not use any products that contain nicotine or tobacco, such as cigarettes and e-cigarettes. If you need help quitting, ask your health care provider.  Visit your dentist if you have not gone yet during your pregnancy. Use a soft toothbrush to brush your teeth and be gentle when you floss. Contact a health care provider if:  You  have dizziness.  You have mild pelvic cramps, pelvic pressure, or nagging pain in the abdominal area.  You have persistent nausea, vomiting, or diarrhea.  You have a bad smelling vaginal discharge.  You have pain when you urinate. Get help right away if:  You have a fever.  You are leaking fluid from your vagina.  You have spotting or bleeding from your vagina.  You have severe abdominal cramping or pain.  You have rapid weight gain or weight loss.  You have shortness of breath with chest pain.  You notice sudden or extreme swelling of your face, hands, ankles, feet, or legs.  You have not felt your baby move in over an hour.  You have severe headaches that do not go away when you take medicine.  You have vision changes. Summary  The second trimester is from week 14 through week 27 (months 4 through 6). It is also a time when the fetus is growing rapidly.  Your body goes through many changes during pregnancy. The changes vary from woman to woman.  Avoid all smoking, herbs, alcohol, and unprescribed drugs. These chemicals affect the formation and growth your baby.  Do not use any tobacco products, such as cigarettes, chewing tobacco, and e-cigarettes. If you need help quitting, ask your health care provider.  Contact your health care provider if you have any questions. Keep all prenatal visits as told by your health care provider. This is important. This information is not intended to replace advice given to you by your health care provider. Make sure you discuss any questions you have with your health care provider.

## 2020-08-05 LAB — INTEGRATED 2
AFP MoM: 1.52
Alpha-Fetoprotein: 51.3 ng/mL
Crown Rump Length: 71.8 mm
DIA MoM: 0.96
DIA Value: 144.7 pg/mL
Estriol, Unconjugated: 1.5 ng/mL
Gest. Age on Collection Date: 13.1 weeks
Gestational Age: 16.3 weeks
Maternal Age at EDD: 30.5 yr
Nuchal Translucency (NT): 1.6 mm
Nuchal Translucency MoM: 0.98
Number of Fetuses: 1
PAPP-A MoM: 2.41
PAPP-A Value: 2987.8 ng/mL
Test Results:: NEGATIVE
Weight: 152 [lb_av]
Weight: 153 [lb_av]
hCG MoM: 0.45
hCG Value: 15.5 IU/mL
uE3 MoM: 1.57

## 2020-08-07 ENCOUNTER — Encounter: Payer: Self-pay | Admitting: Women's Health

## 2020-08-08 LAB — CYTOLOGY - PAP
Chlamydia: NEGATIVE
Comment: NEGATIVE
Comment: NEGATIVE
Comment: NORMAL
Diagnosis: NEGATIVE
High risk HPV: NEGATIVE
Neisseria Gonorrhea: NEGATIVE

## 2020-08-28 ENCOUNTER — Other Ambulatory Visit: Payer: Self-pay | Admitting: Advanced Practice Midwife

## 2020-08-28 DIAGNOSIS — Z363 Encounter for antenatal screening for malformations: Secondary | ICD-10-CM

## 2020-08-29 ENCOUNTER — Encounter: Payer: Self-pay | Admitting: Women's Health

## 2020-08-29 ENCOUNTER — Other Ambulatory Visit: Payer: Self-pay

## 2020-08-29 ENCOUNTER — Ambulatory Visit (INDEPENDENT_AMBULATORY_CARE_PROVIDER_SITE_OTHER): Payer: Medicaid Other

## 2020-08-29 ENCOUNTER — Ambulatory Visit (INDEPENDENT_AMBULATORY_CARE_PROVIDER_SITE_OTHER): Payer: Medicaid Other | Admitting: Women's Health

## 2020-08-29 VITALS — BP 97/63 | HR 77 | Wt 154.8 lb

## 2020-08-29 DIAGNOSIS — Z363 Encounter for antenatal screening for malformations: Secondary | ICD-10-CM

## 2020-08-29 DIAGNOSIS — Z3A20 20 weeks gestation of pregnancy: Secondary | ICD-10-CM

## 2020-08-29 DIAGNOSIS — Z3480 Encounter for supervision of other normal pregnancy, unspecified trimester: Secondary | ICD-10-CM

## 2020-08-29 DIAGNOSIS — Z3482 Encounter for supervision of other normal pregnancy, second trimester: Secondary | ICD-10-CM

## 2020-08-29 NOTE — Patient Instructions (Signed)
Vickie Lloyd, thank you for choosing our office today! We appreciate the opportunity to meet your healthcare needs. You may receive a short survey by mail, e-mail, or through [employer]. If you are happy with your care we would appreciate if you could take just a few minutes to complete the survey questions. We read all of your comments and take your feedback very seriously. Thank you again for choosing our office.  Center for [employer] Team at [facility] at [facility] ([address] [place], [place] [address]) Entrance C, located off of [address] Free 24/7 valet parking  Go to [email or web address] to register for FREE online childbirth classes  Call the office [phone]) or go to [facility] if: You begin to severe cramping Your water breaks.  Sometimes it is a big gush of fluid, sometimes it is just a trickle that keeps getting your panties wet or running down your legs You have vaginal bleeding.  It is normal to have a small amount of spotting if your cervix was checked.   [facility] Pediatricians/Family Doctors [facility]): [address] Colette Ribas, [phone]           [facility]: [address], [phone]                [facility]): [address], [phone] (call to ask if accepting patients) [facility] Department: [address], [place], [phone]    [facility] Pediatricians/Family Doctors [facility]): [phone] [address], [phone] [facility]: [address] [place], [phone] [facility]: [address], [phone]  [facility] Doctors  [facility]): [phone] [facility]: [address] [phone]   [facility] Doctors [facility]: [address], [phone]  [facility] Doctors  [facility]: [phone], [phone]  Home Blood Pressure Monitoring for Patients   Your provider has recommended that you check your blood pressure (BP) at least once a week at home. If you do not have a blood pressure cuff at home, one will be provided for you. Contact your provider if you have not received your monitor within 1 week.   Helpful Tips for Accurate Home Blood Pressure Checks  Don't smoke, exercise, or drink caffeine 30 minutes before checking your BP Use the restroom before checking your BP (a full bladder can raise your pressure) Relax in a comfortable upright chair Feet on the ground Left arm resting comfortably on a flat surface at the level of your heart Legs uncrossed Back supported Sit quietly and don't talk Place the cuff on your bare arm Adjust snuggly, so that only two fingertips can fit between your skin and the top of the cuff Check 2 readings separated by at least one minute Keep a log of your BP readings For a visual, please reference this diagram: http://ccnc.care/bpdiagram  Provider Name: [facility]     Phone: [phone]  Zone 1: ALL CLEAR  Continue to monitor your symptoms:  BP reading is less than 140 (top number) or less than 90 (bottom number)  No right upper stomach pain No headaches or seeing spots No feeling nauseated or throwing up No swelling in face and hands  Zone 2: CAUTION Call your doctor's office for any of the following:  BP reading is greater than 140 (top number) or greater than  90 (bottom number)  Stomach pain under your ribs in the middle or right side Headaches or seeing spots Feeling nauseated or throwing up Swelling in face and hands  Zone 3: EMERGENCY  Seek immediate medical care if you have any of the following:  BP reading is greater than160 (top number) or greater than 110 (bottom number) Severe headaches not improving with Tylenol Serious difficulty catching your breath Any worsening symptoms from  Zone 2     Second Trimester of Pregnancy The second trimester is from week 14 through week 27 (months 4 through 6). The second trimester is often a time when you feel your best. Your body has adjusted to being pregnant, and you begin to feel better physically. Usually, morning sickness has lessened or quit completely, you may have more energy, and you may have an increase in appetite. The second trimester is also a time when the fetus is growing rapidly. At the end of the sixth month, the fetus is about 9 inches long and weighs about 1 pounds. You will likely begin to feel the baby move (quickening) between 16 and 20 weeks of pregnancy. Body changes during your second trimester Your body continues to go through many changes during your second trimester. The changes vary from woman to woman. Your weight will continue to increase. You will notice your lower abdomen bulging out. You may begin to get stretch marks on your hips, abdomen, and breasts. You may develop headaches that can be relieved by medicines. The medicines should be approved by your health care provider. You may urinate more often because the fetus is pressing on your bladder. You may develop or continue to have heartburn as a result of your pregnancy. You may develop constipation because certain hormones are causing the muscles that push waste through your intestines to slow down. You may develop hemorrhoids or swollen, bulging veins (varicose veins). You may have back pain. This is caused by: Weight gain. Pregnancy hormones that are relaxing the joints in your pelvis. A shift in weight and the muscles that support your balance. Your breasts will continue to grow and they will continue to become tender. Your gums may bleed and may be sensitive to brushing and flossing. Dark spots or blotches (chloasma, mask of pregnancy) may develop on your face. This will likely fade after the baby is born. A dark line from your belly button to  the pubic area (linea nigra) may appear. This will likely fade after the baby is born. You may have changes in your hair. These can include thickening of your hair, rapid growth, and changes in texture. Some women also have hair loss during or after pregnancy, or hair that feels dry or thin. Your hair will most likely return to normal after your baby is born.  What to expect at prenatal visits During a routine prenatal visit: You will be weighed to make sure you and the fetus are growing normally. Your blood pressure will be taken. Your abdomen will be measured to track your baby's growth. The fetal heartbeat will be listened to. Any test results from the previous visit will be discussed.  Your health care provider may ask you: How you are feeling. If you are feeling the baby move. If you have had any abnormal symptoms, such as leaking fluid, bleeding, severe headaches, or abdominal cramping. If you are using any tobacco products, including cigarettes, chewing tobacco, and electronic cigarettes. If you have any questions.  Other tests that may be performed during   your second trimester include: Blood tests that check for: Low iron levels (anemia). High blood sugar that affects pregnant women (gestational diabetes) between 24 and 28 weeks. Rh antibodies. This is to check for a protein on red blood cells (Rh factor). Urine tests to check for infections, diabetes, or protein in the urine. An ultrasound to confirm the proper growth and development of the baby. An amniocentesis to check for possible genetic problems. Fetal screens for spina bifida and Down syndrome. HIV (human immunodeficiency virus) testing. Routine prenatal testing includes screening for HIV, unless you choose not to have this test.  Follow these instructions at home: Medicines Follow your health care provider's instructions regarding medicine use. Specific medicines may be either safe or unsafe to take during  pregnancy. Take a prenatal vitamin that contains at least 600 micrograms (mcg) of folic acid. If you develop constipation, try taking a stool softener if your health care provider approves. Eating and drinking Eat a balanced diet that includes fresh fruits and vegetables, whole grains, good sources of protein such as meat, eggs, or tofu, and low-fat dairy. Your health care provider will help you determine the amount of weight gain that is right for you. Avoid raw meat and uncooked cheese. These carry germs that can cause birth defects in the baby. If you have low calcium intake from food, talk to your health care provider about whether you should take a daily calcium supplement. Limit foods that are high in fat and processed sugars, such as fried and sweet foods. To prevent constipation: Drink enough fluid to keep your urine clear or pale yellow. Eat foods that are high in fiber, such as fresh fruits and vegetables, whole grains, and beans. Activity Exercise only as directed by your health care provider. Most women can continue their usual exercise routine during pregnancy. Try to exercise for 30 minutes at least 5 days a week. Stop exercising if you experience uterine contractions. Avoid heavy lifting, wear low heel shoes, and practice good posture. A sexual relationship may be continued unless your health care provider directs you otherwise. Relieving pain and discomfort Wear a good support bra to prevent discomfort from breast tenderness. Take warm sitz baths to soothe any pain or discomfort caused by hemorrhoids. Use hemorrhoid cream if your health care provider approves. Rest with your legs elevated if you have leg cramps or low back pain. If you develop varicose veins, wear support hose. Elevate your feet for 15 minutes, 3-4 times a day. Limit salt in your diet. Prenatal Care Write down your questions. Take them to your prenatal visits. Keep all your prenatal visits as told by your health  care provider. This is important. Safety Wear your seat belt at all times when driving. Make a list of emergency phone numbers, including numbers for family, friends, the hospital, and police and fire departments. General instructions Ask your health care provider for a referral to a local prenatal education class. Begin classes no later than the beginning of month 6 of your pregnancy. Ask for help if you have counseling or nutritional needs during pregnancy. Your health care provider can offer advice or refer you to specialists for help with various needs. Do not use hot tubs, steam rooms, or saunas. Do not douche or use tampons or scented sanitary pads. Do not cross your legs for long periods of time. Avoid cat litter boxes and soil used by cats. These carry germs that can cause birth defects in the baby and possibly loss of the   fetus by miscarriage or stillbirth. Avoid all smoking, herbs, alcohol, and unprescribed drugs. Chemicals in these products can affect the formation and growth of the baby. Do not use any products that contain nicotine or tobacco, such as cigarettes and e-cigarettes. If you need help quitting, ask your health care provider. Visit your dentist if you have not gone yet during your pregnancy. Use a soft toothbrush to brush your teeth and be gentle when you floss. Contact a health care provider if: You have dizziness. You have mild pelvic cramps, pelvic pressure, or nagging pain in the abdominal area. You have persistent nausea, vomiting, or diarrhea. You have a bad smelling vaginal discharge. You have pain when you urinate. Get help right away if: You have a fever. You are leaking fluid from your vagina. You have spotting or bleeding from your vagina. You have severe abdominal cramping or pain. You have rapid weight gain or weight loss. You have shortness of breath with chest pain. You notice sudden or extreme swelling of your face, hands, ankles, feet, or legs. You  have not felt your baby move in over an hour. You have severe headaches that do not go away when you take medicine. You have vision changes. Summary The second trimester is from week 14 through week 27 (months 4 through 6). It is also a time when the fetus is growing rapidly. Your body goes through many changes during pregnancy. The changes vary from woman to woman. Avoid all smoking, herbs, alcohol, and unprescribed drugs. These chemicals affect the formation and growth your baby. Do not use any tobacco products, such as cigarettes, chewing tobacco, and e-cigarettes. If you need help quitting, ask your health care provider. Contact your health care provider if you have any questions. Keep all prenatal visits as told by your health care provider. This is important. This information is not intended to replace advice given to you by your health care provider. Make sure you discuss any questions you have with your health care provider. Document Released: 02/12/2001 Document Revised: 07/27/2015 Document Reviewed: 04/21/2012 Elsevier Interactive Patient Education  2017 Elsevier Inc.  

## 2020-08-29 NOTE — Progress Notes (Signed)
[name] 20+3 wks,cephalic,cx 4.2 cm,posterior placenta gr 0,normal ovaries,svp of fluid 3.8 cm,EFW 363 g 52%,FHR 152 bpm,anatomy complete,no obvious abnormalities

## 2020-08-29 NOTE — Progress Notes (Signed)
LOW-RISK PREGNANCY VISIT Patient name: Vickie Lloyd MRN [id number]  Date of birth: 07/15/1990 Chief Complaint:   Routine Prenatal Visit and Pregnancy Ultrasound  History of Present Illness:   Vickie Lloyd is a 30 y.o. G55P2002 female at [redacted]w[redacted]d with an Estimated Date of Delivery: 01/13/21 being seen today for ongoing management of a low-risk pregnancy.   Today she reports no complaints. Contractions: Not present. Vag. Bleeding: None.  Movement: Present. denies leaking of fluid.  Depression screen [facility] 2/9 07/12/2020 09/30/2017 08/06/2017 11/18/2016 01/19/2016  Decreased Interest 0 0 0 0 0  Down, Depressed, Hopeless 0 0 0 0 0  PHQ - 2 Score 0 0 0 0 0  Altered sleeping 0 - - - -  Tired, decreased energy 0 - - - -  Change in appetite 0 - - - -  Feeling bad or failure about yourself  0 - - - -  Trouble concentrating 0 - - - -  Moving slowly or fidgety/restless 0 - - - -  Suicidal thoughts 0 - - - -  PHQ-9 Score 0 - - - -     GAD 7 : Generalized Anxiety Score 07/12/2020  Nervous, Anxious, on Edge 0  Control/stop worrying 0  Worry too much - different things 0  Trouble relaxing 0  Restless 0  Easily annoyed or irritable 0  Afraid - awful might happen 0  Total GAD 7 Score 0      Review of Systems:   Pertinent items are noted in HPI Denies abnormal vaginal discharge w/ itching/odor/irritation, headaches, visual changes, shortness of breath, chest pain, abdominal pain, severe nausea/vomiting, or problems with urination or bowel movements unless otherwise stated above. Pertinent History Reviewed:  Reviewed past medical,surgical, social, obstetrical and family history.  Reviewed problem list, medications and allergies. Physical Assessment:   Vitals:   08/29/20 1151  BP: 97/63  Pulse: 77  Weight: 154 lb 12.8 oz (70.2 kg)  Body mass index is 25.76 kg/m.        Physical Examination:   General appearance: Well appearing, and in no distress  Mental status: Alert, oriented to  person, place, and time  Skin: Warm & dry  Cardiovascular: Normal heart rate noted  Respiratory: Normal respiratory effort, no distress  Abdomen: Soft, gravid, nontender  Pelvic: Cervical exam deferred         Extremities: Edema: None  Fetal Status: Fetal Heart Rate (bpm): 152 u/s   Movement: Present   [name] 20+3 wks,cephalic,cx 4.2 cm,posterior placenta gr 0,normal ovaries,svp of fluid 3.8 cm,EFW 363 g 52%,FHR 152 bpm,anatomy complete,no obvious abnormalities    Chaperone: N/A   No results found for this or any previous visit (from the past 24 hour(s)).  Assessment & Plan:  1) Low-risk pregnancy G3P2002 at [redacted]w[redacted]d with an Estimated Date of Delivery: 01/13/21    Meds: No orders of the defined types were placed in this encounter.  Labs/procedures today: U/S  Plan:  Continue routine obstetrical care  Next visit: prefers in person    Reviewed: Preterm labor symptoms and general obstetric precautions including but not limited to vaginal bleeding, contractions, leaking of fluid and fetal movement were reviewed in detail with the patient.  All questions were answered. Does have home bp cuff. Office bp cuff given: not applicable. Check bp weekly, let us know if consistently >140 and/or >90.  Follow-up: Return in about 4 weeks (around 09/26/2020) for LROB, CNM, in person.  No future appointments.  No orders of the defined  types were placed in this encounter.  Cheral Marker CNM, [facility] 08/29/2020 12:03 PM

## 2020-09-25 ENCOUNTER — Encounter: Payer: Self-pay | Admitting: Advanced Practice Midwife

## 2020-09-25 ENCOUNTER — Ambulatory Visit (INDEPENDENT_AMBULATORY_CARE_PROVIDER_SITE_OTHER): Payer: Medicaid Other | Admitting: Advanced Practice Midwife

## 2020-09-25 ENCOUNTER — Other Ambulatory Visit: Payer: Self-pay

## 2020-09-25 VITALS — BP 97/66 | HR 69 | Wt 160.0 lb

## 2020-09-25 DIAGNOSIS — Z3A24 24 weeks gestation of pregnancy: Secondary | ICD-10-CM

## 2020-09-25 DIAGNOSIS — Z3480 Encounter for supervision of other normal pregnancy, unspecified trimester: Secondary | ICD-10-CM

## 2020-09-25 NOTE — Progress Notes (Signed)
   PRENATAL VISIT NOTE  Subjective:  Vickie Lloyd is a 30 y.o. G3P2002 at [redacted]w[redacted]d being seen today for ongoing prenatal care.  She is currently monitored for the following issues for this low-risk pregnancy and has Encounter for supervision of normal pregnancy, antepartum and Rubella non-immune status, antepartum on their problem list.  Patient reports no complaints.  Contractions: Not present.  .  Movement: Present. Denies leaking of fluid.   The following portions of the patient's history were reviewed and updated as appropriate: allergies, current medications, past family history, past medical history, past social history, past surgical history and problem list.   Objective:   Vitals:   09/25/20 1135  BP: 97/66  Pulse: 69  Weight: 160 lb (72.6 kg)    Fetal Status: Fetal Heart Rate (bpm): 150 Fundal Height: 24 cm Movement: Present     General:  Alert, oriented and cooperative. Patient is in no acute distress.  Skin: Skin is warm and dry. No rash noted.   Cardiovascular: Normal heart rate noted  Respiratory: Normal respiratory effort, no problems with respiration noted  Abdomen: Soft, gravid, appropriate for gestational age.  Pain/Pressure: Present     Pelvic: Cervical exam deferred        Extremities: Normal range of motion.  Edema: None  Mental Status: Normal mood and affect. Normal behavior. Normal judgment and thought content.   Assessment and Plan:  Pregnancy: G3P2002 at [redacted]w[redacted]d 1. Supervision of other normal pregnancy, antepartum - curious about going to beach in October at apprx 36-37 weeks. Answered questions so patient could make a decision about this.   2. [redacted] weeks Gestation - GTT at next visit   Preterm labor symptoms and general obstetric precautions including but not limited to vaginal bleeding, contractions, leaking of fluid and fetal movement were reviewed in detail with the patient. Please refer to After Visit Summary for other counseling recommendations.    Return in about 4 weeks (around 10/23/2020) for needs GTT and 28 week labs at next visit .  No future appointments.  Thressa Sheller DNP, CNM  09/25/20  12:25 PM

## 2020-10-23 ENCOUNTER — Other Ambulatory Visit: Payer: Medicaid Other

## 2020-10-24 ENCOUNTER — Ambulatory Visit (INDEPENDENT_AMBULATORY_CARE_PROVIDER_SITE_OTHER): Payer: Medicaid Other | Admitting: Women's Health

## 2020-10-24 ENCOUNTER — Other Ambulatory Visit: Payer: Self-pay

## 2020-10-24 ENCOUNTER — Other Ambulatory Visit: Payer: Medicaid Other

## 2020-10-24 ENCOUNTER — Encounter: Payer: Self-pay | Admitting: Women's Health

## 2020-10-24 VITALS — BP 114/70 | HR 85 | Wt 164.0 lb

## 2020-10-24 DIAGNOSIS — Z3A28 28 weeks gestation of pregnancy: Secondary | ICD-10-CM

## 2020-10-24 DIAGNOSIS — Z3483 Encounter for supervision of other normal pregnancy, third trimester: Secondary | ICD-10-CM

## 2020-10-24 DIAGNOSIS — Z3480 Encounter for supervision of other normal pregnancy, unspecified trimester: Secondary | ICD-10-CM

## 2020-10-24 DIAGNOSIS — O24410 Gestational diabetes mellitus in pregnancy, diet controlled: Secondary | ICD-10-CM

## 2020-10-24 NOTE — Progress Notes (Signed)
LOW-RISK PREGNANCY VISIT Patient name: Vickie Lloyd MRN [id number]  Date of birth: 07/11/1990 Chief Complaint:   Routine Prenatal Visit (PN2)  History of Present Illness:   Vickie Lloyd is a 30 y.o. G45P2002 female at [redacted]w[redacted]d with an Estimated Date of Delivery: 01/13/21 being seen today for ongoing management of a low-risk pregnancy.   Today she reports cramping and low back pain. Contractions: Not present. Vag. Bleeding: None.  Movement: Present. denies leaking of fluid.  Depression screen [facility] 2/9 07/12/2020 09/30/2017 08/06/2017 11/18/2016 01/19/2016  Decreased Interest 0 0 0 0 0  Down, Depressed, Hopeless 0 0 0 0 0  PHQ - 2 Score 0 0 0 0 0  Altered sleeping 0 - - - -  Tired, decreased energy 0 - - - -  Change in appetite 0 - - - -  Feeling bad or failure about yourself  0 - - - -  Trouble concentrating 0 - - - -  Moving slowly or fidgety/restless 0 - - - -  Suicidal thoughts 0 - - - -  PHQ-9 Score 0 - - - -     GAD 7 : Generalized Anxiety Score 07/12/2020  Nervous, Anxious, on Edge 0  Control/stop worrying 0  Worry too much - different things 0  Trouble relaxing 0  Restless 0  Easily annoyed or irritable 0  Afraid - awful might happen 0  Total GAD 7 Score 0      Review of Systems:   Pertinent items are noted in HPI Denies abnormal vaginal discharge w/ itching/odor/irritation, headaches, visual changes, shortness of breath, chest pain, abdominal pain, severe nausea/vomiting, or problems with urination or bowel movements unless otherwise stated above. Pertinent History Reviewed:  Reviewed past medical,surgical, social, obstetrical and family history.  Reviewed problem list, medications and allergies. Physical Assessment:   Vitals:   10/24/20 0845  BP: 114/70  Pulse: 85  Weight: 164 lb (74.4 kg)  Body mass index is 27.29 kg/m.        Physical Examination:   General appearance: Well appearing, and in no distress  Mental status: Alert, oriented to person, place, and  time  Skin: Warm & dry  Cardiovascular: Normal heart rate noted  Respiratory: Normal respiratory effort, no distress  Abdomen: Soft, gravid, nontender  Pelvic: Cervical exam deferred         Extremities: Edema: None  Fetal Status: Fetal Heart Rate (bpm): 144 Fundal Height: 27 cm Movement: Present    Chaperone: N/A   No results found for this or any previous visit (from the past 24 hour(s)).  Assessment & Plan:  1) Low-risk pregnancy G3P2002 at [redacted]w[redacted]d with an Estimated Date of Delivery: 01/13/21   2) Occ cramping, low back pain, discussed relief measures, ptl s/s   Meds: No orders of the defined types were placed in this encounter.  Labs/procedures today: PN2 and declines tdap today, plans next visit  Plan:  Continue routine obstetrical care  Next visit: prefers in person    Reviewed: Preterm labor symptoms and general obstetric precautions including but not limited to vaginal bleeding, contractions, leaking of fluid and fetal movement were reviewed in detail with the patient.  All questions were answered. Does have home bp cuff. Office bp cuff given: not applicable. Check bp weekly, let us know if consistently >140 and/or >90.  Follow-up: Return in about 4 weeks (around 11/21/2020) for LROB, CNM, in person.  Future Appointments  Date Time Provider Department Center  10/24/2020  9:15 AM  [facility]    No orders of the defined types were placed in this encounter.  Cheral Marker CNM, [facility] 10/24/2020 9:04 AM

## 2020-10-24 NOTE — Patient Instructions (Signed)
Lowella Bandy, thank you for choosing our office today! We appreciate the opportunity to meet your healthcare needs. You may receive a short survey by mail, e-mail, or through [employer]. If you are happy with your care we would appreciate if you could take just a few minutes to complete the survey questions. We read all of your comments and take your feedback very seriously. Thank you again for choosing our office.  Center for [employer] Team at [facility]  [facility] at [facility] ([address] [place], [place] [address]) Entrance C, located off of [address] Free 24/7 valet parking   CLASSES: Go to [email or web address] to register for classes (childbirth, breastfeeding, waterbirth, infant CPR, daddy bootcamp, etc.)  Call the office [phone]) or go to [facility] if: You begin to have strong, frequent contractions Your water breaks.  Sometimes it is a big gush of fluid, sometimes it is just a trickle that keeps getting your panties wet or running down your legs You have vaginal bleeding.  It is normal to have a small amount of spotting if your cervix was checked.  You don't feel your baby moving like normal.  If you don't, get you something to eat and drink and lay down and focus on feeling your baby move.   If your baby is still not moving like normal, you should call the office or go to [facility].  Call the office [phone]) or go to [facility] hospital for these signs of pre-eclampsia: Severe headache that does not go away with Tylenol Visual changes- seeing spots, double, blurred vision Pain under your right breast or upper abdomen that does not go away with Tums or heartburn medicine Nausea and/or vomiting Severe swelling in your hands, feet, and face   Tdap Vaccine It is recommended that you get the Tdap vaccine during the third trimester of EACH pregnancy to help protect your baby from getting pertussis (whooping cough) 27-36 weeks is the BEST time to do  this so that you can pass the protection on to your baby. During pregnancy is better than after pregnancy, but if you are unable to get it during pregnancy it will be offered at the hospital.  You can get this vaccine with [name], at the health department, your family doctor, or some local pharmacies Everyone who will be around your baby should also be up-to-date on their vaccines before the baby comes. Adults (who are not pregnant) only need 1 dose of Tdap during adulthood.   [facility] Pediatricians/Family Doctors [facility]): [address] [place], [phone]           [facility]: [address], [phone]                [facility] Medicine [facility]): [address], [phone] (call to ask if accepting patients) [facility] Department: [address], [place], [phone]    [facility] Pediatricians/Family Doctors [facility]): [phone] [address], [phone] [facility]: [address] [place], [phone] [facility]: [address], [phone]  [facility] Doctors  [facility]): [phone] [facility]: [address] [phone]   [facility] Doctors [facility]: [address], [phone]  [facility] Family Doctors  [facility]: [phone], [phone]  Home Blood Pressure Monitoring for Patients   Your provider has recommended that you check your  blood pressure (BP) at least once a week at home. If you do not have a blood pressure cuff at home, one will be provided for you. Contact your provider if you have not received your monitor within 1 week.   Helpful Tips for Accurate Home Blood Pressure Checks  Don't smoke, exercise, or drink caffeine 30 minutes before checking your BP Use the restroom before checking your BP (a full bladder can raise your  pressure) Relax in a comfortable upright chair Feet on the ground Left arm resting comfortably on a flat surface at the level of your heart Legs uncrossed Back supported Sit quietly and don't talk Place the cuff on your bare arm Adjust snuggly, so that only two fingertips can fit between your skin and the top of the cuff Check 2 readings separated by at least one minute Keep a log of your BP readings For a visual, please reference this diagram: http://ccnc.care/bpdiagram  Provider Name: [facility]     Phone: [phone]  Zone 1: ALL CLEAR  Continue to monitor your symptoms:  BP reading is less than 140 (top number) or less than 90 (bottom number)  No right upper stomach pain No headaches or seeing spots No feeling nauseated or throwing up No swelling in face and hands  Zone 2: CAUTION Call your doctor's office for any of the following:  BP reading is greater than 140 (top number) or greater than 90 (bottom number)  Stomach pain under your ribs in the middle or right side Headaches or seeing spots Feeling nauseated or throwing up Swelling in face and hands  Zone 3: EMERGENCY  Seek immediate medical care if you have any of the following:  BP reading is greater than160 (top number) or greater than 110 (bottom number) Severe headaches not improving with Tylenol Serious difficulty catching your breath Any worsening symptoms from Zone 2   Third Trimester of Pregnancy The third trimester is from week 29 through week 42, months 7 through 9. The third trimester is a time when the fetus is growing rapidly. At the end of the ninth month, the fetus is about 20 inches in length and weighs 6-10 pounds.  BODY CHANGES Your body goes through many changes during pregnancy. The changes vary from woman to woman.  Your weight will continue to increase. You can expect to gain 25-35 pounds (11-16 kg) by the end of the pregnancy. You may begin to get stretch marks on your hips, abdomen,  and breasts. You may urinate more often because the fetus is moving lower into your pelvis and pressing on your bladder. You may develop or continue to have heartburn as a result of your pregnancy. You may develop constipation because certain hormones are causing the muscles that push waste through your intestines to slow down. You may develop hemorrhoids or swollen, bulging veins (varicose veins). You may have pelvic pain because of the weight gain and pregnancy hormones relaxing your joints between the bones in your pelvis. Backaches may result from overexertion of the muscles supporting your posture. You may have changes in your hair. These can include thickening of your hair, rapid growth, and changes in texture. Some women also have hair loss during or after pregnancy, or hair that feels dry or thin. Your hair will most likely return to normal after your baby is born. Your breasts will continue to grow and be tender. A yellow discharge may leak from your breasts called colostrum. Your belly button may stick out. You may   feel short of breath because of your expanding uterus. You may notice the fetus "dropping," or moving lower in your abdomen. You may have a bloody mucus discharge. This usually occurs a few days to a week before labor begins. Your cervix becomes thin and soft (effaced) near your due date. WHAT TO EXPECT AT YOUR PRENATAL EXAMS  You will have prenatal exams every 2 weeks until week 36. Then, you will have weekly prenatal exams. During a routine prenatal visit: You will be weighed to make sure you and the fetus are growing normally. Your blood pressure is taken. Your abdomen will be measured to track your baby's growth. The fetal heartbeat will be listened to. Any test results from the previous visit will be discussed. You may have a cervical check near your due date to see if you have effaced. At around 36 weeks, your caregiver will check your cervix. At the same time, your  caregiver will also perform a test on the secretions of the vaginal tissue. This test is to determine if a type of bacteria, Group B streptococcus, is present. Your caregiver will explain this further. Your caregiver may ask you: What your birth plan is. How you are feeling. If you are feeling the baby move. If you have had any abnormal symptoms, such as leaking fluid, bleeding, severe headaches, or abdominal cramping. If you have any questions. Other tests or screenings that may be performed during your third trimester include: Blood tests that check for low iron levels (anemia). Fetal testing to check the health, activity level, and growth of the fetus. Testing is done if you have certain medical conditions or if there are problems during the pregnancy. FALSE LABOR You may feel small, irregular contractions that eventually go away. These are called Braxton Hicks contractions, or false labor. Contractions may last for hours, days, or even weeks before true labor sets in. If contractions come at regular intervals, intensify, or become painful, it is best to be seen by your caregiver.  SIGNS OF LABOR  Menstrual-like cramps. Contractions that are 5 minutes apart or less. Contractions that start on the top of the uterus and spread down to the lower abdomen and back. A sense of increased pelvic pressure or back pain. A watery or bloody mucus discharge that comes from the vagina. If you have any of these signs before the 37th week of pregnancy, call your caregiver right away. You need to go to the hospital to get checked immediately. HOME CARE INSTRUCTIONS  Avoid all smoking, herbs, alcohol, and unprescribed drugs. These chemicals affect the formation and growth of the baby. Follow your caregiver's instructions regarding medicine use. There are medicines that are either safe or unsafe to take during pregnancy. Exercise only as directed by your caregiver. Experiencing uterine cramps is a good sign to  stop exercising. Continue to eat regular, healthy meals. Wear a good support bra for breast tenderness. Do not use hot tubs, steam rooms, or saunas. Wear your seat belt at all times when driving. Avoid raw meat, uncooked cheese, cat litter boxes, and soil used by cats. These carry germs that can cause birth defects in the baby. Take your prenatal vitamins. Try taking a stool softener (if your caregiver approves) if you develop constipation. Eat more high-fiber foods, such as fresh vegetables or fruit and whole grains. Drink plenty of fluids to keep your urine clear or pale yellow. Take warm sitz baths to soothe any pain or discomfort caused by hemorrhoids. Use hemorrhoid cream if   your caregiver approves. If you develop varicose veins, wear support hose. Elevate your feet for 15 minutes, 3-4 times a day. Limit salt in your diet. Avoid heavy lifting, wear low heal shoes, and practice good posture. Rest a lot with your legs elevated if you have leg cramps or low back pain. Visit your dentist if you have not gone during your pregnancy. Use a soft toothbrush to brush your teeth and be gentle when you floss. A sexual relationship may be continued unless your caregiver directs you otherwise. Do not travel far distances unless it is absolutely necessary and only with the approval of your caregiver. Take prenatal classes to understand, practice, and ask questions about the labor and delivery. Make a trial run to the hospital. Pack your hospital bag. Prepare the baby's nursery. Continue to go to all your prenatal visits as directed by your caregiver. SEEK MEDICAL CARE IF: You are unsure if you are in labor or if your water has broken. You have dizziness. You have mild pelvic cramps, pelvic pressure, or nagging pain in your abdominal area. You have persistent nausea, vomiting, or diarrhea. You have a bad smelling vaginal discharge. You have pain with urination. SEEK IMMEDIATE MEDICAL CARE IF:  You  have a fever. You are leaking fluid from your vagina. You have spotting or bleeding from your vagina. You have severe abdominal cramping or pain. You have rapid weight loss or gain. You have shortness of breath with chest pain. You notice sudden or extreme swelling of your face, hands, ankles, feet, or legs. You have not felt your baby move in over an hour. You have severe headaches that do not go away with medicine. You have vision changes. Document Released: 02/12/2001 Document Revised: 02/23/2013 Document Reviewed: 04/21/2012 ExitCare Patient Information 2015 [employer]. This information is not intended to replace advice given to you by your health care provider. Make sure you discuss any questions you have with your health care provider.       

## 2020-10-25 ENCOUNTER — Other Ambulatory Visit: Payer: Self-pay | Admitting: Women's Health

## 2020-10-25 ENCOUNTER — Telehealth: Payer: Self-pay

## 2020-10-25 DIAGNOSIS — O0990 Supervision of high risk pregnancy, unspecified, unspecified trimester: Secondary | ICD-10-CM

## 2020-10-25 DIAGNOSIS — O24410 Gestational diabetes mellitus in pregnancy, diet controlled: Secondary | ICD-10-CM

## 2020-10-25 LAB — CBC
Hematocrit: 34.5 % (ref 34.0–46.6)
Hemoglobin: 11 g/dL — ABNORMAL LOW (ref 11.1–15.9)
MCH: 27.6 pg (ref 26.6–33.0)
MCHC: 31.9 g/dL (ref 31.5–35.7)
MCV: 87 fL (ref 79–97)
Platelets: 256 10*3/uL (ref 150–450)
RBC: 3.98 x10E6/uL (ref 3.77–5.28)
RDW: 12.4 % (ref 11.7–15.4)
WBC: 10.6 10*3/uL (ref 3.4–10.8)

## 2020-10-25 LAB — GLUCOSE TOLERANCE, 2 HOURS W/ 1HR
Glucose, 1 hour: 196 mg/dL — ABNORMAL HIGH (ref 65–179)
Glucose, 2 hour: 125 mg/dL (ref 65–152)
Glucose, Fasting: 74 mg/dL (ref 65–91)

## 2020-10-25 LAB — ANTIBODY SCREEN: Antibody Screen: NEGATIVE

## 2020-10-25 LAB — RPR: RPR Ser Ql: NONREACTIVE

## 2020-10-25 LAB — HIV ANTIBODY (ROUTINE TESTING W REFLEX): HIV Screen 4th Generation wRfx: NONREACTIVE

## 2020-10-25 MED ORDER — ASPIRIN 81 MG PO TBEC: 81.0000 mg | DELAYED_RELEASE_TABLET | Freq: Every day | ORAL | 3 refills | Status: DC

## 2020-10-25 MED ORDER — GLUCOSE BLOOD VI STRP: ORAL_STRIP | 99 refills | Status: DC

## 2020-10-25 MED ORDER — ONETOUCH ULTRASOFT LANCETS MISC: 99 refills | Status: DC

## 2020-10-25 MED ORDER — ACCU-CHEK GUIDE ME W/DEVICE KIT: 1.0000 | PACK | Freq: Four times a day (QID) | 0 refills | Status: DC

## 2020-10-25 NOTE — Telephone Encounter (Signed)
Diabetic supplies ordered and referral made.  Called pt, no answer, left vm for pt to read Mychart msg from Joellyn Haff and call if she has any questions.

## 2020-10-25 NOTE — Addendum Note (Signed)
Addended by: Leilani Able, Kadija Cruzen A on: 10/25/2020 10:08 AM   Modules accepted: Orders

## 2020-10-26 ENCOUNTER — Encounter (HOSPITAL_COMMUNITY): Payer: Self-pay | Admitting: Obstetrics and Gynecology

## 2020-10-26 ENCOUNTER — Other Ambulatory Visit: Payer: Self-pay

## 2020-10-26 ENCOUNTER — Emergency Department (HOSPITAL_COMMUNITY)
Admission: AD | Admit: 2020-10-26 | Discharge: 2020-10-27 | Disposition: A | Discharge status: Home / Self Care | Payer: Medicaid Other | Attending: Obstetrics and Gynecology | Admitting: Obstetrics and Gynecology

## 2020-10-26 ENCOUNTER — Emergency Department (HOSPITAL_COMMUNITY): Payer: Medicaid Other

## 2020-10-26 DIAGNOSIS — Z3A28 28 weeks gestation of pregnancy: Secondary | ICD-10-CM | POA: Diagnosis not present

## 2020-10-26 DIAGNOSIS — Z3A00 Weeks of gestation of pregnancy not specified: Secondary | ICD-10-CM | POA: Diagnosis not present

## 2020-10-26 DIAGNOSIS — O26893 Other specified pregnancy related conditions, third trimester: Secondary | ICD-10-CM | POA: Diagnosis not present

## 2020-10-26 DIAGNOSIS — O3483 Maternal care for other abnormalities of pelvic organs, third trimester: Secondary | ICD-10-CM | POA: Diagnosis not present

## 2020-10-26 DIAGNOSIS — R109 Unspecified abdominal pain: Secondary | ICD-10-CM | POA: Diagnosis not present

## 2020-10-26 DIAGNOSIS — O0990 Supervision of high risk pregnancy, unspecified, unspecified trimester: Secondary | ICD-10-CM

## 2020-10-26 DIAGNOSIS — O26899 Other specified pregnancy related conditions, unspecified trimester: Secondary | ICD-10-CM | POA: Diagnosis not present

## 2020-10-26 DIAGNOSIS — R102 Pelvic and perineal pain: Secondary | ICD-10-CM | POA: Diagnosis not present

## 2020-10-26 DIAGNOSIS — Z79899 Other long term (current) drug therapy: Secondary | ICD-10-CM | POA: Insufficient documentation

## 2020-10-26 DIAGNOSIS — O99113 Other diseases of the blood and blood-forming organs and certain disorders involving the immune mechanism complicating pregnancy, third trimester: Secondary | ICD-10-CM | POA: Insufficient documentation

## 2020-10-26 DIAGNOSIS — N83201 Unspecified ovarian cyst, right side: Secondary | ICD-10-CM | POA: Insufficient documentation

## 2020-10-26 DIAGNOSIS — M545 Low back pain, unspecified: Secondary | ICD-10-CM | POA: Diagnosis not present

## 2020-10-26 DIAGNOSIS — R110 Nausea: Secondary | ICD-10-CM | POA: Diagnosis not present

## 2020-10-26 DIAGNOSIS — O321XX0 Maternal care for breech presentation, not applicable or unspecified: Secondary | ICD-10-CM | POA: Diagnosis not present

## 2020-10-26 DIAGNOSIS — D72829 Elevated white blood cell count, unspecified: Secondary | ICD-10-CM | POA: Diagnosis not present

## 2020-10-26 DIAGNOSIS — M549 Dorsalgia, unspecified: Secondary | ICD-10-CM | POA: Diagnosis not present

## 2020-10-26 DIAGNOSIS — R1031 Right lower quadrant pain: Secondary | ICD-10-CM

## 2020-10-26 HISTORY — DX: Type 2 diabetes mellitus without complications: E11.9

## 2020-10-26 LAB — CBC WITH DIFFERENTIAL/PLATELET
Abs Immature Granulocytes: 0.18 10*3/uL — ABNORMAL HIGH (ref 0.00–0.07)
Basophils Absolute: 0 10*3/uL (ref 0.0–0.1)
Basophils Relative: 0 %
Eosinophils Absolute: 0.1 10*3/uL (ref 0.0–0.5)
Eosinophils Relative: 1 %
HCT: 32.8 % — ABNORMAL LOW (ref 36.0–46.0)
Hemoglobin: 11.1 g/dL — ABNORMAL LOW (ref 12.0–15.0)
Immature Granulocytes: 1 %
Lymphocytes Relative: 14 %
Lymphs Abs: 1.7 10*3/uL (ref 0.7–4.0)
MCH: 29 pg (ref 26.0–34.0)
MCHC: 33.8 g/dL (ref 30.0–36.0)
MCV: 85.6 fL (ref 80.0–100.0)
Monocytes Absolute: 0.7 10*3/uL (ref 0.1–1.0)
Monocytes Relative: 6 %
Neutro Abs: 10 10*3/uL — ABNORMAL HIGH (ref 1.7–7.7)
Neutrophils Relative %: 78 %
Platelets: 263 10*3/uL (ref 150–400)
RBC: 3.83 MIL/uL — ABNORMAL LOW (ref 3.87–5.11)
RDW: 13 % (ref 11.5–15.5)
WBC: 12.7 10*3/uL — ABNORMAL HIGH (ref 4.0–10.5)
nRBC: 0 % (ref 0.0–0.2)

## 2020-10-26 LAB — URINALYSIS, ROUTINE W REFLEX MICROSCOPIC
Bilirubin Urine: NEGATIVE
Glucose, UA: NEGATIVE mg/dL
Hgb urine dipstick: NEGATIVE
Ketones, ur: 5 mg/dL — AB
Leukocytes,Ua: NEGATIVE
Nitrite: NEGATIVE
Protein, ur: NEGATIVE mg/dL
Specific Gravity, Urine: 1.024 (ref 1.005–1.030)
pH: 6 (ref 5.0–8.0)

## 2020-10-26 LAB — COMPREHENSIVE METABOLIC PANEL
ALT: 18 U/L (ref 0–44)
AST: 17 U/L (ref 15–41)
Albumin: 2.5 g/dL — ABNORMAL LOW (ref 3.5–5.0)
Alkaline Phosphatase: 64 U/L (ref 38–126)
Anion gap: 7 (ref 5–15)
BUN: 9 mg/dL (ref 6–20)
CO2: 22 mmol/L (ref 22–32)
Calcium: 8.3 mg/dL — ABNORMAL LOW (ref 8.9–10.3)
Chloride: 106 mmol/L (ref 98–111)
Creatinine, Ser: 0.63 mg/dL (ref 0.44–1.00)
GFR, Estimated: >60 mL/min (ref >60)
Glucose, Bld: 86 mg/dL (ref 70–99)
Potassium: 3.9 mmol/L (ref 3.5–5.1)
Sodium: 135 mmol/L (ref 135–145)
Total Bilirubin: 0.7 mg/dL (ref 0.3–1.2)
Total Protein: 5.9 g/dL — ABNORMAL LOW (ref 6.5–8.1)

## 2020-10-26 MED ORDER — LACTATED RINGERS IV BOLUS
1000.0000 mL | Freq: Once | INTRAVENOUS | Status: AC
  Administered 2020-10-26: 1000 mL via INTRAVENOUS

## 2020-10-26 MED ORDER — ACETAMINOPHEN 500 MG PO TABS
1000.0000 mg | ORAL_TABLET | Freq: Once | ORAL | Status: AC
  Administered 2020-10-26: 1000 mg via ORAL
  Filled 2020-10-26: qty 2

## 2020-10-26 NOTE — MAU Provider Note (Signed)
History     CSN: [id number]  Arrival date and time: 10/26/20 1727   Event Date/Time   First Provider Initiated Contact with Patient 10/26/20 1904      Chief Complaint  Patient presents with   Abdominal Pain   Back Pain   HPI  Ms.Vickie Lloyd is a 30 y.o.female [id number] @[redacted]w[redacted]d  here in MAU with complaints or RLQ pain that radiates around to her lower right back. The pain started acutely at 1500 today. This is a new problem. She has not tried anything for the pain. She currently rates her pain 6/10. The pain is sharp and comes and goes. At times the pain feels crampy. + fetal movement. No bleeding.   OB History     Gravida  3   Para  2   Term  2   Preterm  0   AB  0   Living  2      SAB  0   IAB  0   Ectopic  0   Multiple  0   Live Births  2           Past Medical History:  Diagnosis Date   Diabetes mellitus without complication (HCC)    Migraine     Past Surgical History:  Procedure Laterality Date   birthmark removed as a child     NO PAST SURGERIES      Family History  Problem Relation Age of Onset   Heart attack Paternal Grandfather    Stroke Maternal Grandmother    Suicidality Maternal Grandfather    Diabetes Father     Social History   Tobacco Use   Smoking status: Never   Smokeless tobacco: Never  Vaping Use   Vaping Use: Never used  Substance Use Topics   Alcohol use: Not Currently    Comment: rarely   Drug use: No    Allergies: No Known Allergies  Medications Prior to Admission  Medication Sig Dispense Refill Last Dose   aspirin 81 MG EC tablet Take 1 tablet (81 mg total) by mouth daily. Swallow whole. 90 tablet 3 10/26/2020   Blood Glucose Monitoring Suppl (ACCU-CHEK GUIDE ME) w/Device KIT 1 each by Does not apply route 4 (four) times daily. 1 kit 0 10/26/2020   glucose blood test strip Use as instructed 100 each PRN 10/26/2020   Lancets (ONETOUCH ULTRASOFT) lancets Use as directed to check blood sugar 4 times daily 100  each PRN 10/26/2020   prenatal vitamin w/FE, FA (PRENATAL 1 + 1) 27-1 MG TABS tablet Take 1 tablet by mouth daily at 12 noon. 30 tablet 12 More than a month   promethazine (PHENERGAN) 25 MG tablet Take 1 tablet (25 mg total) by mouth every 6 (six) hours as needed for nausea or vomiting. (Patient not taking: No sig reported) 30 tablet 1    terconazole (TERAZOL 7) 0.4 % vaginal cream Place 1 applicator vaginally at bedtime. (Patient not taking: No sig reported) 45 g 0    Results for orders placed or performed during the hospital encounter of 10/26/20 (from the past 48 hour(s))  Urinalysis, Routine w reflex microscopic Urine, Clean Catch     Status: Abnormal   Collection Time: 10/26/20  6:29 PM  Result Value Ref Range   Color, Urine YELLOW YELLOW   APPearance HAZY (A) CLEAR   Specific Gravity, Urine 1.024 1.005 - 1.030   pH 6.0 5.0 - 8.0   Glucose, UA NEGATIVE NEGATIVE mg/dL   Hgb   urine dipstick NEGATIVE NEGATIVE   Bilirubin Urine NEGATIVE NEGATIVE   Ketones, ur 5 (A) NEGATIVE mg/dL   Protein, ur NEGATIVE NEGATIVE mg/dL   Nitrite NEGATIVE NEGATIVE   Leukocytes,Ua NEGATIVE NEGATIVE    Comment: Performed at [facility] [address], [place], [place] [address]  CBC with Differential/Platelet     Status: Abnormal   Collection Time: 10/26/20  7:40 PM  Result Value Ref Range   WBC 12.7 (H) 4.0 - 10.5 K/uL   RBC 3.83 (L) 3.87 - 5.11 MIL/uL   Hemoglobin 11.1 (L) 12.0 - 15.0 g/dL   HCT 32.8 (L) 36.0 - 46.0 %   MCV 85.6 80.0 - 100.0 fL   MCH 29.0 26.0 - 34.0 pg   MCHC 33.8 30.0 - 36.0 g/dL   RDW 13.0 11.5 - 15.5 %   Platelets 263 150 - 400 K/uL   nRBC 0.0 0.0 - 0.2 %   Neutrophils Relative % 78 %   Neutro Abs 10.0 (H) 1.7 - 7.7 K/uL   Lymphocytes Relative 14 %   Lymphs Abs 1.7 0.7 - 4.0 K/uL   Monocytes Relative 6 %   Monocytes Absolute 0.7 0.1 - 1.0 K/uL   Eosinophils Relative 1 %   Eosinophils Absolute 0.1 0.0 - 0.5 K/uL   Basophils Relative 0 %   Basophils Absolute 0.0 0.0 -  0.1 K/uL   Immature Granulocytes 1 %   Abs Immature Granulocytes 0.18 (H) 0.00 - 0.07 K/uL    Comment: Performed at [facility] [address], [place], [place] [address]  Comprehensive metabolic panel     Status: Abnormal   Collection Time: 10/26/20  7:40 PM  Result Value Ref Range   Sodium 135 135 - 145 mmol/L   Potassium 3.9 3.5 - 5.1 mmol/L   Chloride 106 98 - 111 mmol/L   CO2 22 22 - 32 mmol/L   Glucose, Bld 86 70 - 99 mg/dL    Comment: Glucose reference range applies only to samples taken after fasting for at least 8 hours.   BUN 9 6 - 20 mg/dL   Creatinine, Ser 0.63 0.44 - 1.00 mg/dL   Calcium 8.3 (L) 8.9 - 10.3 mg/dL   Total Protein 5.9 (L) 6.5 - 8.1 g/dL   Albumin 2.5 (L) 3.5 - 5.0 g/dL   AST 17 15 - 41 U/L   ALT 18 0 - 44 U/L   Alkaline Phosphatase 64 38 - 126 U/L   Total Bilirubin 0.7 0.3 - 1.2 mg/dL   GFR, Estimated >60 >60 mL/min    Comment: (NOTE) Calculated using the CKD-EPI Creatinine Equation (2021)    Anion gap 7 5 - 15    Comment: Performed at [facility], [place] [address], [place], [place] [address]    Review of Systems  Constitutional:  Negative for fever.  Gastrointestinal:  Positive for abdominal pain and nausea. Negative for constipation, diarrhea and vomiting.  Genitourinary:  Negative for dysuria, frequency and urgency.  Musculoskeletal:  Positive for back pain.  Physical Exam   Blood pressure 105/71, pulse 94, temperature 97.8 F (36.6 C), temperature source Oral, resp. rate 19, height 5' 5" (1.651 m), weight 74.6 kg, last menstrual period 04/08/2020, SpO2 99 %.  Physical Exam Vitals and nursing note reviewed.  Constitutional:      General: She is not in acute distress.    Appearance: She is well-developed. She is not ill-appearing, toxic-appearing or diaphoretic.  HENT:     Head:  Normocephalic.  Abdominal:     Palpations: Abdomen is soft.     Tenderness: There is abdominal tenderness in the right lower quadrant. There is no  right CVA tenderness, left CVA tenderness, guarding or rebound.  Genitourinary:    Comments: Cervix: closed, thick, anterior.  Exam by Jennifer Rasch, NP  Neurological:     Mental Status: She is alert.   Fetal Tracing: Baseline: 145 bpm Variability: Moderate  Accelerations: 15x15 Decelerations: None Toco: None.   MAU Course  Procedures None  MDM  Patient had a normal CBC/ WBC 2 days ago. Today CBC >12 with left shift of 10 Tylenol given 1 gram with Lr bolus> patient has 5 of ketones in urine.  Given New onset elevated WBC and RLQ pain with MRI abdomen/pelvis.  Report given to Marie Williams CNM who resumes care of the patient.   Rasch, Jennifer I, NP   Assessment and Plan   

## 2020-10-26 NOTE — MAU Note (Signed)
Presents with c/o lright sided lower abdominal and back pain that began 1500 this afternoon.  Reports pain is intermittent and feels like ctxs.  Denies VB or LOF.  Endorses +FM.

## 2020-10-26 NOTE — ED Provider Notes (Signed)
Emergency Medicine Provider OB Triage Evaluation Note  Vickie Lloyd is a 30 y.o. female, [id number], at [redacted]w[redacted]d gestation who presents to the emergency department with complaints of pelvic pain.  Review of  Systems  Positive: R abd pain/pelvic pain Negative: fever, chills, n/v/d, dysuria, vaginal bleeding or vaginal discharge  Physical Exam  BP 120/78 (BP Location: Left Arm)   Pulse 97   Temp 98.6 F (37 C) (Oral)   Resp 16   LMP 04/08/2020   SpO2 95%  General: Awake, no distress  HEENT: Atraumatic  Resp: Normal effort  Cardiac: Normal rate Abd: Nondistended, TTP to RLQ, no guarding or rebound tenderness MSK: Moves all extremities without difficulty Neuro: Speech clear  Medical Decision Making  Pt evaluated for pregnancy concern and is stable for transfer to MAU. Pt is in agreement with plan for transfer.  5:39 PM Discussed with MAU APP, Victorino Dike, who accepts patient in transfer.  Clinical Impression  No diagnosis found.     Fayrene Helper, PA-C 10/26/20 1740    Koleen Distance, MD 10/26/20 [phone]

## 2020-10-26 NOTE — MAU Provider Note (Signed)
History     CSN: [id number]  Arrival date and time: 10/26/20 1727   Event Date/Time   First Provider Initiated Contact with Patient 10/26/20 1904      Chief Complaint  Patient presents with   Abdominal Pain   Back Pain   Vickie Lloyd is a 30 y.o.female G3P2002 '@[redacted]w[redacted]d'   here in MAU with complaints or RLQ pain that radiates around to her lower right back. The pain started acutely at 1500 today. This is a new problem. She has not tried anything for the pain. She currently rates her pain 6/10. The pain is sharp and comes and goes. At times the pain feels crampy. + fetal movement. No bleeding  Abdominal Pain Associated symptoms include nausea. Pertinent negatives include no constipation, diarrhea, dysuria, fever, frequency or vomiting.  Back Pain Associated symptoms include abdominal pain. Pertinent negatives include no dysuria or fever.  .   OB History     Gravida  3   Para  2   Term  2   Preterm  0   AB  0   Living  2      SAB  0   IAB  0   Ectopic  0   Multiple  0   Live Births  2           Past Medical History:  Diagnosis Date   Diabetes mellitus without complication ([place])    Migraine     Past Surgical History:  Procedure Laterality Date   birthmark removed as a child     NO PAST SURGERIES      Family History  Problem Relation Age of Onset   Heart attack Paternal Grandfather    Stroke Maternal Grandmother    Suicidality Maternal Grandfather    Diabetes Father     Social History   Tobacco Use   Smoking status: Never   Smokeless tobacco: Never  Vaping Use   Vaping Use: Never used  Substance Use Topics   Alcohol use: Not Currently    Comment: rarely   Drug use: No    Allergies: No Known Allergies  Medications Prior to Admission  Medication Sig Dispense Refill Last Dose   aspirin 81 MG EC tablet Take 1 tablet (81 mg total) by mouth daily. Swallow whole. 90 tablet 3 10/26/2020   Blood Glucose Monitoring Suppl (ACCU-CHEK GUIDE  ME) w/Device KIT 1 each by Does not apply route 4 (four) times daily. 1 kit 0 10/26/2020   glucose blood test strip Use as instructed 100 each PRN 10/26/2020   Lancets (ONETOUCH ULTRASOFT) lancets Use as directed to check blood sugar 4 times daily 100 each PRN 10/26/2020   prenatal vitamin w/FE, FA (PRENATAL 1 + 1) 27-1 MG TABS tablet Take 1 tablet by mouth daily at 12 noon. 30 tablet 12 More than a month   promethazine (PHENERGAN) 25 MG tablet Take 1 tablet (25 mg total) by mouth every 6 (six) hours as needed for nausea or vomiting. (Patient not taking: No sig reported) 30 tablet 1    terconazole (TERAZOL 7) 0.4 % vaginal cream Place 1 applicator vaginally at bedtime. (Patient not taking: No sig reported) 45 g 0    Results for orders placed or performed during the hospital encounter of 10/26/20 (from the past 48 hour(s))  Urinalysis, Routine w reflex microscopic Urine, Clean Catch     Status: Abnormal   Collection Time: 10/26/20  6:29 PM  Result Value Ref Range   Color, Urine YELLOW  YELLOW   APPearance HAZY (A) CLEAR   Specific Gravity, Urine 1.024 1.005 - 1.030   pH 6.0 5.0 - 8.0   Glucose, UA NEGATIVE NEGATIVE mg/dL   Hgb urine dipstick NEGATIVE NEGATIVE   Bilirubin Urine NEGATIVE NEGATIVE   Ketones, ur 5 (A) NEGATIVE mg/dL   Protein, ur NEGATIVE NEGATIVE mg/dL   Nitrite NEGATIVE NEGATIVE   Leukocytes,Ua NEGATIVE NEGATIVE    Comment: Performed at [facility] [address], [place], [place] [address]  CBC with Differential/Platelet     Status: Abnormal   Collection Time: 10/26/20  7:40 PM  Result Value Ref Range   WBC 12.7 (H) 4.0 - 10.5 K/uL   RBC 3.83 (L) 3.87 - 5.11 MIL/uL   Hemoglobin 11.1 (L) 12.0 - 15.0 g/dL   HCT 32.8 (L) 36.0 - 46.0 %   MCV 85.6 80.0 - 100.0 fL   MCH 29.0 26.0 - 34.0 pg   MCHC 33.8 30.0 - 36.0 g/dL   RDW 13.0 11.5 - 15.5 %   Platelets 263 150 - 400 K/uL   nRBC 0.0 0.0 - 0.2 %   Neutrophils Relative % 78 %   Neutro Abs 10.0 (H) 1.7 - 7.7 K/uL    Lymphocytes Relative 14 %   Lymphs Abs 1.7 0.7 - 4.0 K/uL   Monocytes Relative 6 %   Monocytes Absolute 0.7 0.1 - 1.0 K/uL   Eosinophils Relative 1 %   Eosinophils Absolute 0.1 0.0 - 0.5 K/uL   Basophils Relative 0 %   Basophils Absolute 0.0 0.0 - 0.1 K/uL   Immature Granulocytes 1 %   Abs Immature Granulocytes 0.18 (H) 0.00 - 0.07 K/uL    Comment: Performed at [facility] [address], [place], [place] [address]  Comprehensive metabolic panel     Status: Abnormal   Collection Time: 10/26/20  7:40 PM  Result Value Ref Range   Sodium 135 135 - 145 mmol/L   Potassium 3.9 3.5 - 5.1 mmol/L   Chloride 106 98 - 111 mmol/L   CO2 22 22 - 32 mmol/L   Glucose, Bld 86 70 - 99 mg/dL    Comment: Glucose reference range applies only to samples taken after fasting for at least 8 hours.   BUN 9 6 - 20 mg/dL   Creatinine, Ser 0.63 0.44 - 1.00 mg/dL   Calcium 8.3 (L) 8.9 - 10.3 mg/dL   Total Protein 5.9 (L) 6.5 - 8.1 g/dL   Albumin 2.5 (L) 3.5 - 5.0 g/dL   AST 17 15 - 41 U/L   ALT 18 0 - 44 U/L   Alkaline Phosphatase 64 38 - 126 U/L   Total Bilirubin 0.7 0.3 - 1.2 mg/dL   GFR, Estimated >60 >60 mL/min    Comment: (NOTE) Calculated using the CKD-EPI Creatinine Equation (2021)    Anion gap 7 5 - 15    Comment: Performed at [facility], [place] [address], [place], [place] [address]    Review of Systems  Constitutional:  Negative for fever.  Gastrointestinal:  Positive for abdominal pain and nausea. Negative for constipation, diarrhea and vomiting.  Genitourinary:  Negative for dysuria, frequency and urgency.  Musculoskeletal:  Positive for back pain.  Physical Exam   Blood pressure 105/71, pulse 94, temperature 97.8 F (36.6 C), temperature source Oral, resp. rate 19, height '5\' 5"'  (1.651 m), weight 74.6 kg, last menstrual period 04/08/2020, SpO2 99 %.  Physical Exam Vitals and nursing note reviewed.  Constitutional:  General: She is not in acute distress.    Appearance:  She is well-developed. She is not ill-appearing, toxic-appearing or diaphoretic.  HENT:     Head: Normocephalic.  Abdominal:     Palpations: Abdomen is soft.     Tenderness: There is abdominal tenderness in the right lower quadrant. There is no right CVA tenderness, left CVA tenderness, guarding or rebound.  Genitourinary:    Comments: Cervix: closed, thick, anterior.  Exam by Noni Saupe, NP  Neurological:     Mental Status: She is alert.   Fetal Tracing: Baseline: 145 bpm Variability: Moderate  Accelerations: 15x15 Decelerations: None Toco: None.   MAU Course  Procedures None  MDM  Patient had a normal CBC/ WBC 2 days ago. Today CBC >12 with left shift of 10 Tylenol given 1 gram with Lr bolus> patient has 5 of ketones in urine.  Given New onset elevated WBC and RLQ pain with MRI abdomen/pelvis.  Report given to Hansel Feinstein CNM who resumes care of the patient.   Seabron Spates, CNM  Have been waiting for MRI 2330:  MRI transporter arrived to take patient  MR PELVIS WO CONTRAST  Result Date: 10/27/2020 CLINICAL DATA:  Pregnancy with positive fetal motion. Right lower quadrant abdominal pain and back pain beginning this afternoon. EXAM: MRI ABDOMEN AND PELVIS WITHOUT CONTRAST TECHNIQUE: Multiplanar multisequence MR imaging of the abdomen and pelvis was performed. No intravenous contrast was administered. COMPARISON:  None. FINDINGS: COMBINED FINDINGS FOR BOTH MR ABDOMEN AND PELVIS Lower chest: Visualized lung apices are unremarkable. No pleural effusions. Visualized esophagus is decompressed. Hepatobiliary: No mass or other parenchymal abnormality identified. Pancreas: No mass, inflammatory changes, or other parenchymal abnormality identified. Spleen:  Within normal limits in size and appearance. Adrenals/Urinary Tract: No adrenal gland nodules. Kidneys are symmetrical in size. Mild prominence of the renal collecting system and ureter on the right, likely related to  extrinsic compression from the pregnancy. No focal mass identified. Stomach/Bowel: Stomach, small bowel, and colon are not abnormally distended. No wall thickening or inflammatory changes are identified. The appendix is not definitively identified but no inflammatory changes or fluid collections are demonstrated in the right lower quadrant that would suggest evidence of appendicitis. Vascular/Lymphatic: No pathologically enlarged lymph nodes identified. No abdominal aortic aneurysm demonstrated. Reproductive: Gravid uterus with single intrauterine pregnancy. Fetus is in breech presentation. The placenta is posterior and fundal. No evidence of abruption. Small cyst in the right ovary. No abnormal adnexal masses. Other: No free fluid in the abdomen or pelvis. Abdominal wall musculature appears intact. Musculoskeletal: Marrow signal intensities are homogeneous without focal bone lesion identified. IMPRESSION: 1. Single intrauterine pregnancy. Fetus in breech presentation. Placenta is posterior and fundal without evidence of abruption. 2. Small cyst in the right ovary.  No abnormal adnexal masses. 3. Appendix is not definitively identified but no fluid collection or inflammatory stranding are demonstrated in the right lower quadrant. 4. Mildly dilated right renal collecting system, likely due to extrinsic compression from the pregnancy. Electronically Signed   By: Lucienne Capers M.D.   On: 10/27/2020 01:11   MR ABDOMEN WO CONTRAST  Result Date: 10/27/2020 CLINICAL DATA:  Pregnancy with positive fetal motion. Right lower quadrant abdominal pain and back pain beginning this afternoon. EXAM: MRI ABDOMEN AND PELVIS WITHOUT CONTRAST TECHNIQUE: Multiplanar multisequence MR imaging of the abdomen and pelvis was performed. No intravenous contrast was administered. COMPARISON:  None. FINDINGS: COMBINED FINDINGS FOR BOTH MR ABDOMEN AND PELVIS Lower chest: Visualized lung apices are unremarkable.  No pleural effusions.  Visualized esophagus is decompressed. Hepatobiliary: No mass or other parenchymal abnormality identified. Pancreas: No mass, inflammatory changes, or other parenchymal abnormality identified. Spleen:  Within normal limits in size and appearance. Adrenals/Urinary Tract: No adrenal gland nodules. Kidneys are symmetrical in size. Mild prominence of the renal collecting system and ureter on the right, likely related to extrinsic compression from the pregnancy. No focal mass identified. Stomach/Bowel: Stomach, small bowel, and colon are not abnormally distended. No wall thickening or inflammatory changes are identified. The appendix is not definitively identified but no inflammatory changes or fluid collections are demonstrated in the right lower quadrant that would suggest evidence of appendicitis. Vascular/Lymphatic: No pathologically enlarged lymph nodes identified. No abdominal aortic aneurysm demonstrated. Reproductive: Gravid uterus with single intrauterine pregnancy. Fetus is in breech presentation. The placenta is posterior and fundal. No evidence of abruption. Small cyst in the right ovary. No abnormal adnexal masses. Other: No free fluid in the abdomen or pelvis. Abdominal wall musculature appears intact. Musculoskeletal: Marrow signal intensities are homogeneous without focal bone lesion identified. IMPRESSION: 1. Single intrauterine pregnancy. Fetus in breech presentation. Placenta is posterior and fundal without evidence of abruption. 2. Small cyst in the right ovary.  No abnormal adnexal masses. 3. Appendix is not definitively identified but no fluid collection or inflammatory stranding are demonstrated in the right lower quadrant. 4. Mildly dilated right renal collecting system, likely due to extrinsic compression from the pregnancy. Electronically Signed   By: Lucienne Capers M.D.   On: 10/27/2020 01:11    Reviewed normal MRI results Reviewed clear UA DIscussed mild leukocytosis can  be  nonspecific Discussed this may be a gastroenteritis, given colicky pain and nausea Will discharge home with supportive care Assessment and Plan  A:  Single IUP at [redacted]w[redacted]d      Abdominal cramping       Mild leukocytosis       Nausea  P  Discharge home      Has rx for phenergan      Supportive care      [facility]    Encouraged to return if she develops worsening of symptoms, increase in pain, fever, or other concerning symptoms.

## 2020-10-27 DIAGNOSIS — R1031 Right lower quadrant pain: Secondary | ICD-10-CM | POA: Diagnosis not present

## 2020-10-27 DIAGNOSIS — O26899 Other specified pregnancy related conditions, unspecified trimester: Secondary | ICD-10-CM | POA: Diagnosis not present

## 2020-10-27 DIAGNOSIS — Z3A00 Weeks of gestation of pregnancy not specified: Secondary | ICD-10-CM | POA: Diagnosis not present

## 2020-10-27 DIAGNOSIS — M549 Dorsalgia, unspecified: Secondary | ICD-10-CM | POA: Diagnosis not present

## 2020-10-30 ENCOUNTER — Telehealth: Payer: Self-pay | Admitting: *Deleted

## 2020-10-30 NOTE — Telephone Encounter (Signed)
Transition Care Management Follow-up Telephone Call Date of discharge and from where: 10/27/2020 - [facility] How have you been since you were released from the hospital? "Feeling a little better" Any questions or concerns? No  Items Reviewed: Did the pt receive and understand the discharge instructions provided? Yes  Medications obtained and verified?  N/A Other? No  Any new allergies since your discharge? No  Dietary orders reviewed? No Do you have support at home? Yes    Functional Questionnaire: (I = Independent and D = Dependent) ADLs: I  Bathing/Dressing- I  Meal Prep- I  Eating- I  Maintaining continence- I  Transferring/Ambulation- I  Managing Meds- I  Follow up appointments reviewed:  PCP Hospital f/u appt confirmed? No   Specialist Hospital f/u appt confirmed? Yes  Scheduled to see OBGYN on 11/21/2020 @ 0910. Are transportation arrangements needed? No  If their condition worsens, is the pt aware to call PCP or go to the Emergency Dept.? Yes Was the patient provided with contact information for the PCP's office or ED? Yes Was to pt encouraged to call back with questions or concerns? Yes

## 2020-10-31 ENCOUNTER — Telehealth: Payer: Self-pay | Admitting: Registered"

## 2020-10-31 ENCOUNTER — Other Ambulatory Visit: Payer: Medicaid Other

## 2020-10-31 NOTE — Telephone Encounter (Signed)
LVM no-show appt, pls call back to reschedule appt, offered a 3:15 appointment for today

## 2020-11-21 ENCOUNTER — Other Ambulatory Visit: Payer: Self-pay

## 2020-11-21 ENCOUNTER — Encounter: Payer: Self-pay | Admitting: Women's Health

## 2020-11-21 ENCOUNTER — Ambulatory Visit (INDEPENDENT_AMBULATORY_CARE_PROVIDER_SITE_OTHER): Payer: Medicaid Other | Admitting: Women's Health

## 2020-11-21 VITALS — BP 104/71 | HR 79 | Wt 166.4 lb

## 2020-11-21 DIAGNOSIS — Z3483 Encounter for supervision of other normal pregnancy, third trimester: Secondary | ICD-10-CM

## 2020-11-21 DIAGNOSIS — Z23 Encounter for immunization: Secondary | ICD-10-CM | POA: Diagnosis not present

## 2020-11-21 DIAGNOSIS — Z3A32 32 weeks gestation of pregnancy: Secondary | ICD-10-CM

## 2020-11-21 NOTE — Patient Instructions (Signed)
Maddyx, thank you for choosing our office today! We appreciate the opportunity to meet your healthcare needs. You may receive a short survey by mail, e-mail, or through MyChart. If you are happy with your care we would appreciate if you could take just a few minutes to complete the survey questions. We read all of your comments and take your feedback very seriously. Thank you again for choosing our office.  [facility] [place] ([address] [place], [place] [address]) Entrance C, located off of [address] Free 24/7 valet parking   CLASSES: Go to [email or web address] to register for classes (childbirth, breastfeeding, waterbirth, infant CPR, daddy bootcamp, etc.)  Call the office ([phone]) or go to [facility] if: You begin to have strong, frequent contractions Your water breaks.  Sometimes it is a big gush of fluid, sometimes it is just a trickle that keeps getting your panties wet or running down your legs You have vaginal bleeding.  It is normal to have a small amount of spotting if your cervix was checked.  You don't feel your baby moving like normal.  If you don't, get you something to eat and drink and lay down and focus on feeling your baby move.   If your baby is still not moving like normal, you should call the office or go to [facility].  Call the office ([phone]) or go to [facility] for these signs of pre-eclampsia: Severe headache that does not go away with Tylenol Visual changes- seeing spots, double, blurred vision Pain under your right breast or upper abdomen that does not go away with Tums or heartburn medicine Nausea and/or vomiting Severe swelling in your hands, feet, and face   Tdap Vaccine It is recommended that you get the Tdap vaccine during the third trimester of EACH pregnancy to help protect your baby from getting pertussis (whooping cough) 27-36 weeks is the BEST time to do  this so that you can pass the protection on to your baby. During pregnancy is better than after pregnancy, but if you are unable to get it during pregnancy it will be offered at the hospital.  You can get this vaccine with us, at the health department, your family doctor, or some local pharmacies Everyone who will be around your baby should also be up-to-date on their vaccines before the baby comes. Adults (who are not pregnant) only need 1 dose of Tdap during adulthood.   [place] [facility] Doctors [facility] ([facility]): [address], [phone]           [facility]: [address], [phone]                [facility] ([facility]): [address], [phone] (call to ask if accepting patients) [facility]: [address], [place], [phone]    [place] [facility]/Family Doctors [facility] ([facility]): [address], [phone] [facility]: [address], [phone] [facility]: [address], [phone]  [place] [facility]  [facility] ([facility]): [phone] [facility]: [address], [phone]   [place] [facility]: [address], [phone]  [place] [facility]  [facility]: [address], [phone]  Home Blood Pressure Monitoring for Patients   Your provider has recommended that you check your   blood pressure (BP) at least once a week at home. If you do not have a blood pressure cuff at home, one will be provided for you. Contact your provider if you have not received your monitor within 1 week.   Helpful Tips for Accurate Home Blood Pressure Checks  Don't smoke, exercise, or drink caffeine 30 minutes before checking your BP Use the restroom before checking your BP (a full bladder can raise your  pressure) Relax in a comfortable upright chair Feet on the ground Left arm resting comfortably on a flat surface at the level of your heart Legs uncrossed Back supported Sit quietly and don't talk Place the cuff on your bare arm Adjust snuggly, so that only two fingertips can fit between your skin and the top of the cuff Check 2 readings separated by at least one minute Keep a log of your BP readings For a visual, please reference this diagram: http://ccnc.care/bpdiagram  Provider Name: [facility]     Phone: [phone]  Zone 1: ALL CLEAR  Continue to monitor your symptoms:  BP reading is less than 140 (top number) or less than 90 (bottom number)  No right upper stomach pain No headaches or seeing spots No feeling nauseated or throwing up No swelling in face and hands  Zone 2: CAUTION Call your doctor's office for any of the following:  BP reading is greater than 140 (top number) or greater than 90 (bottom number)  Stomach pain under your ribs in the middle or right side Headaches or seeing spots Feeling nauseated or throwing up Swelling in face and hands  Zone 3: EMERGENCY  Seek immediate medical care if you have any of the following:  BP reading is greater than160 (top number) or greater than 110 (bottom number) Severe headaches not improving with Tylenol Serious difficulty catching your breath Any worsening symptoms from Zone 2   Third Trimester of Pregnancy The third trimester is from week 29 through week 42, months 7 through 9. The third trimester is a time when the fetus is growing rapidly. At the end of the ninth month, the fetus is about 20 inches in length and weighs 6-10 pounds.  BODY CHANGES Your body goes through many changes during pregnancy. The changes vary from woman to woman.  Your weight will continue to increase. You can expect to gain 25-35 pounds (11-16 kg) by the end of the pregnancy. You may begin to get stretch marks on your hips, abdomen,  and breasts. You may urinate more often because the fetus is moving lower into your pelvis and pressing on your bladder. You may develop or continue to have heartburn as a result of your pregnancy. You may develop constipation because certain hormones are causing the muscles that push waste through your intestines to slow down. You may develop hemorrhoids or swollen, bulging veins (varicose veins). You may have pelvic pain because of the weight gain and pregnancy hormones relaxing your joints between the bones in your pelvis. Backaches may result from overexertion of the muscles supporting your posture. You may have changes in your hair. These can include thickening of your hair, rapid growth, and changes in texture. Some women also have hair loss during or after pregnancy, or hair that feels dry or thin. Your hair will most likely return to normal after your baby is born. Your breasts will continue to grow and be tender. A yellow discharge may leak from your breasts called colostrum. Your belly button may stick out. You may   feel short of breath because of your expanding uterus. You may notice the fetus "dropping," or moving lower in your abdomen. You may have a bloody mucus discharge. This usually occurs a few days to a week before labor begins. Your cervix becomes thin and soft (effaced) near your due date. WHAT TO EXPECT AT YOUR PRENATAL EXAMS  You will have prenatal exams every 2 weeks until week 36. Then, you will have weekly prenatal exams. During a routine prenatal visit: You will be weighed to make sure you and the fetus are growing normally. Your blood pressure is taken. Your abdomen will be measured to track your baby's growth. The fetal heartbeat will be listened to. Any test results from the previous visit will be discussed. You may have a cervical check near your due date to see if you have effaced. At around 36 weeks, your caregiver will check your cervix. At the same time, your  caregiver will also perform a test on the secretions of the vaginal tissue. This test is to determine if a type of bacteria, Group B streptococcus, is present. Your caregiver will explain this further. Your caregiver may ask you: What your birth plan is. How you are feeling. If you are feeling the baby move. If you have had any abnormal symptoms, such as leaking fluid, bleeding, severe headaches, or abdominal cramping. If you have any questions. Other tests or screenings that may be performed during your third trimester include: Blood tests that check for low iron levels (anemia). Fetal testing to check the health, activity level, and growth of the fetus. Testing is done if you have certain medical conditions or if there are problems during the pregnancy. FALSE LABOR You may feel small, irregular contractions that eventually go away. These are called Braxton Hicks contractions, or false labor. Contractions may last for hours, days, or even weeks before true labor sets in. If contractions come at regular intervals, intensify, or become painful, it is best to be seen by your caregiver.  SIGNS OF LABOR  Menstrual-like cramps. Contractions that are 5 minutes apart or less. Contractions that start on the top of the uterus and spread down to the lower abdomen and back. A sense of increased pelvic pressure or back pain. A watery or bloody mucus discharge that comes from the vagina. If you have any of these signs before the 37th week of pregnancy, call your caregiver right away. You need to go to the hospital to get checked immediately. HOME CARE INSTRUCTIONS  Avoid all smoking, herbs, alcohol, and unprescribed drugs. These chemicals affect the formation and growth of the baby. Follow your caregiver's instructions regarding medicine use. There are medicines that are either safe or unsafe to take during pregnancy. Exercise only as directed by your caregiver. Experiencing uterine cramps is a good sign to  stop exercising. Continue to eat regular, healthy meals. Wear a good support bra for breast tenderness. Do not use hot tubs, steam rooms, or saunas. Wear your seat belt at all times when driving. Avoid raw meat, uncooked cheese, cat litter boxes, and soil used by cats. These carry germs that can cause birth defects in the baby. Take your prenatal vitamins. Try taking a stool softener (if your caregiver approves) if you develop constipation. Eat more high-fiber foods, such as fresh vegetables or fruit and whole grains. Drink plenty of fluids to keep your urine clear or pale yellow. Take warm sitz baths to soothe any pain or discomfort caused by hemorrhoids. Use hemorrhoid cream if   your caregiver approves. If you develop varicose veins, wear support hose. Elevate your feet for 15 minutes, 3-4 times a day. Limit salt in your diet. Avoid heavy lifting, wear low heal shoes, and practice good posture. Rest a lot with your legs elevated if you have leg cramps or low back pain. Visit your dentist if you have not gone during your pregnancy. Use a soft toothbrush to brush your teeth and be gentle when you floss. A sexual relationship may be continued unless your caregiver directs you otherwise. Do not travel far distances unless it is absolutely necessary and only with the approval of your caregiver. Take prenatal classes to understand, practice, and ask questions about the labor and delivery. Make a trial run to the hospital. Pack your hospital bag. Prepare the baby's nursery. Continue to go to all your prenatal visits as directed by your caregiver. SEEK MEDICAL CARE IF: You are unsure if you are in labor or if your water has broken. You have dizziness. You have mild pelvic cramps, pelvic pressure, or nagging pain in your abdominal area. You have persistent nausea, vomiting, or diarrhea. You have a bad smelling vaginal discharge. You have pain with urination. SEEK IMMEDIATE MEDICAL CARE IF:  You  have a fever. You are leaking fluid from your vagina. You have spotting or bleeding from your vagina. You have severe abdominal cramping or pain. You have rapid weight loss or gain. You have shortness of breath with chest pain. You notice sudden or extreme swelling of your face, hands, ankles, feet, or legs. You have not felt your baby move in over an hour. You have severe headaches that do not go away with medicine. You have vision changes. Document Released: 02/12/2001 Document Revised: 02/23/2013 Document Reviewed: 04/21/2012 ExitCare Patient Information 2015 [employer]. This information is not intended to replace advice given to you by your health care provider. Make sure you discuss any questions you have with your health care provider.       

## 2020-11-21 NOTE — Progress Notes (Signed)
HIGH-RISK PREGNANCY VISIT Patient name: Vickie Lloyd MRN [id number]  Date of birth: June 14, 1990 Chief Complaint:   Routine Prenatal Visit  History of Present Illness:   Vickie Lloyd is a 30 y.o. G55P2002 female at [redacted]w[redacted]d with an Estimated Date of Delivery: 01/13/21 being seen today for ongoing management of a high-risk pregnancy complicated by diabetes mellitus A1DM.    Today she reports  forgot log, reports all sugars are normal, except on 150 (chewed gum right before checking). Has changed her diet a little, but not drastically. Did not eat/drink anything after MN prior to GTT . Contractions: Not present.  .  Movement: Present. denies leaking of fluid.   Depression screen [facility] 2/9 07/12/2020 09/30/2017 08/06/2017 11/18/2016 01/19/2016  Decreased Interest 0 0 0 0 0  Down, Depressed, Hopeless 0 0 0 0 0  PHQ - 2 Score 0 0 0 0 0  Altered sleeping 0 - - - -  Tired, decreased energy 0 - - - -  Change in appetite 0 - - - -  Feeling bad or failure about yourself  0 - - - -  Trouble concentrating 0 - - - -  Moving slowly or fidgety/restless 0 - - - -  Suicidal thoughts 0 - - - -  PHQ-9 Score 0 - - - -     GAD 7 : Generalized Anxiety Score 07/12/2020  Nervous, Anxious, on Edge 0  Control/stop worrying 0  Worry too much - different things 0  Trouble relaxing 0  Restless 0  Easily annoyed or irritable 0  Afraid - awful might happen 0  Total GAD 7 Score 0     Review of Systems:   Pertinent items are noted in HPI Denies abnormal vaginal discharge w/ itching/odor/irritation, headaches, visual changes, shortness of breath, chest pain, abdominal pain, severe nausea/vomiting, or problems with urination or bowel movements unless otherwise stated above. Pertinent History Reviewed:  Reviewed past medical,surgical, social, obstetrical and family history.  Reviewed problem list, medications and allergies. Physical Assessment:   Vitals:   11/21/20 0906  BP: 104/71  Pulse: 79  Weight: 166 lb 6.4  oz (75.5 kg)  Body mass index is 27.69 kg/m.           Physical Examination:   General appearance: alert, well appearing, and in no distress  Mental status: alert, oriented to person, place, and time  Skin: warm & dry   Extremities: Edema: None    Cardiovascular: normal heart rate noted  Respiratory: normal respiratory effort, no distress  Abdomen: gravid, soft, non-tender  Pelvic: Cervical exam deferred         Fetal Status: Fetal Heart Rate (bpm): 132 Fundal Height: 31 cm Movement: Present    Fetal Surveillance Testing today: doppler   Chaperone: N/A    No results found for this or any previous visit (from the past 24 hour(s)).  Assessment & Plan:  High-risk pregnancy: G3P2002 at [redacted]w[redacted]d with an Estimated Date of Delivery: 01/13/21   1) A1DM, all sugars wnl (except for random 150-chewed gum right before), was NPO after MN prior to GTT. Continue QID testing, send pic of log when she gets home and bring log to next appt.  Meds: No orders of the defined types were placed in this encounter.   Labs/procedures today: tdap and declined flu shot  Reviewed: Preterm labor symptoms and general obstetric precautions including but not limited to vaginal bleeding, contractions, leaking of fluid and fetal movement were reviewed in detail with the  patient.  All questions were answered. Does have home bp cuff. Office bp cuff given: not applicable. Check bp weekly, let us know if consistently >140 and/or >90.  Follow-up: Return in about 2 weeks (around 12/05/2020) for HROB, CNM, in person.   Future Appointments  Date Time Provider Department Center  12/05/2020  9:50 AM Cheral Marker, CNM [facility]    Orders Placed This Encounter  Procedures   Tdap vaccine greater than or equal to 7yo IM   Cheral Marker CNM, [facility] 11/21/2020 9:45 AM

## 2020-12-05 ENCOUNTER — Ambulatory Visit (INDEPENDENT_AMBULATORY_CARE_PROVIDER_SITE_OTHER): Payer: Medicaid Other | Admitting: Women's Health

## 2020-12-05 ENCOUNTER — Other Ambulatory Visit: Payer: Self-pay | Admitting: Women's Health

## 2020-12-05 ENCOUNTER — Other Ambulatory Visit: Payer: Self-pay

## 2020-12-05 ENCOUNTER — Encounter: Payer: Self-pay | Admitting: Women's Health

## 2020-12-05 VITALS — BP 104/71 | HR 81 | Wt 168.6 lb

## 2020-12-05 DIAGNOSIS — O24410 Gestational diabetes mellitus in pregnancy, diet controlled: Secondary | ICD-10-CM

## 2020-12-05 DIAGNOSIS — Z3A34 34 weeks gestation of pregnancy: Secondary | ICD-10-CM

## 2020-12-05 DIAGNOSIS — O0993 Supervision of high risk pregnancy, unspecified, third trimester: Secondary | ICD-10-CM

## 2020-12-05 LAB — POCT URINALYSIS DIPSTICK OB
Blood, UA: NEGATIVE
Glucose, UA: NEGATIVE
Ketones, UA: NEGATIVE
Leukocytes, UA: NEGATIVE
Nitrite, UA: NEGATIVE
POC,PROTEIN,UA: NEGATIVE

## 2020-12-05 MED ORDER — PANTOPRAZOLE SODIUM 20 MG PO TBEC: 20.0000 mg | DELAYED_RELEASE_TABLET | Freq: Every day | ORAL | 1 refills | Status: DC

## 2020-12-05 NOTE — Progress Notes (Signed)
HIGH-RISK PREGNANCY VISIT Patient name: Vickie Lloyd MRN [id number]  Date of birth: Apr 19, 1990 Chief Complaint:   Routine Prenatal Visit and High Risk Gestation  History of Present Illness:   Vickie Lloyd is a 30 y.o. G67P2002 female at [redacted]w[redacted]d with an Estimated Date of Delivery: 01/13/21 being seen today for ongoing management of a high-risk pregnancy complicated by diabetes mellitus A1DM.    Today she reports  forgot log again, reports all sugars still completely wnl w/o any diet changes/etc. Reports she was truly NPO after MN for GTT. Marland Kitchen Works as Lawyer, lifting pts, etc. Discussed lifting/pulling/pushing recommendations. Will let us know if needs note. Going to beach in 2wks x1wk. Contractions: Not present. Vag. Bleeding: None.  Movement: Present. denies leaking of fluid.   Depression screen [facility] 2/9 07/12/2020 09/30/2017 08/06/2017 11/18/2016 01/19/2016  Decreased Interest 0 0 0 0 0  Down, Depressed, Hopeless 0 0 0 0 0  PHQ - 2 Score 0 0 0 0 0  Altered sleeping 0 - - - -  Tired, decreased energy 0 - - - -  Change in appetite 0 - - - -  Feeling bad or failure about yourself  0 - - - -  Trouble concentrating 0 - - - -  Moving slowly or fidgety/restless 0 - - - -  Suicidal thoughts 0 - - - -  PHQ-9 Score 0 - - - -     GAD 7 : Generalized Anxiety Score 07/12/2020  Nervous, Anxious, on Edge 0  Control/stop worrying 0  Worry too much - different things 0  Trouble relaxing 0  Restless 0  Easily annoyed or irritable 0  Afraid - awful might happen 0  Total GAD 7 Score 0     Review of Systems:   Pertinent items are noted in HPI Denies abnormal vaginal discharge w/ itching/odor/irritation, headaches, visual changes, shortness of breath, chest pain, abdominal pain, severe nausea/vomiting, or problems with urination or bowel movements unless otherwise stated above. Pertinent History Reviewed:  Reviewed past medical,surgical, social, obstetrical and family history.  Reviewed problem list,  medications and allergies. Physical Assessment:   Vitals:   12/05/20 1005  BP: 104/71  Pulse: 81  Weight: 168 lb 9.6 oz (76.5 kg)  Body mass index is 28.06 kg/m.           Physical Examination:   General appearance: alert, well appearing, and in no distress  Mental status: alert, oriented to person, place, and time  Skin: warm & dry   Extremities: Edema: None    Cardiovascular: normal heart rate noted  Respiratory: normal respiratory effort, no distress  Abdomen: gravid, soft, non-tender  Pelvic: Cervical exam deferred         Fetal Status: Fetal Heart Rate (bpm): 135 Fundal Height: 33 cm Movement: Present    Fetal Surveillance Testing today: doppler   Chaperone: N/A    Results for orders placed or performed in visit on 12/05/20 (from the past 24 hour(s))  POC Urinalysis Dipstick OB   Collection Time: 12/05/20 10:07 AM  Result Value Ref Range   Color, UA     Clarity, UA     Glucose, UA Negative Negative   Bilirubin, UA     Ketones, UA neg    Spec Grav, UA     Blood, UA neg    pH, UA     POC,PROTEIN,UA Negative Negative, Trace, Small (1+), Moderate (2+), Large (3+), 4+   Urobilinogen, UA     Nitrite, UA  neg    Leukocytes, UA Negative Negative   Appearance     Odor      Assessment & Plan:  High-risk pregnancy: G3P2002 at [redacted]w[redacted]d with an Estimated Date of Delivery: 01/13/21   1) A1DM, forgot log this and last visit, reports all sugars wnl w/o any diet changes, etc. To send me pics of all sugars when she gets home.   2) Upcoming travel> Discussed she is at a high risk for DVT/PE when pregnancy and traveling increases that risk. Recommended walking around q 1hr, don't cross legs, and dorsiflex feet often to help decrease r/f DVT/PE. Travel not recommended if ptl s/s or at term  Meds: No orders of the defined types were placed in this encounter.   Labs/procedures today: none  Treatment Plan:  efw next visit  Reviewed: Preterm labor symptoms and general obstetric  precautions including but not limited to vaginal bleeding, contractions, leaking of fluid and fetal movement were reviewed in detail with the patient.  All questions were answered. Does have home bp cuff. Office bp cuff given: not applicable. Check bp weekly, let us know if consistently >140 and/or >90.  Follow-up: Return in about 2 weeks (around 12/19/2020) for HROB, US:EFW, MD or CNM, in person.   Future Appointments  Date Time Provider Department Center  12/26/2020  9:30 AM [facility] [name] [facility] None  12/26/2020 10:10 AM Cheral Marker, CNM [facility]    Orders Placed This Encounter  Procedures   POC Urinalysis Dipstick OB   Cheral Marker CNM, [facility] 12/05/2020 10:22 AM

## 2020-12-05 NOTE — Patient Instructions (Signed)
Vickie Lloyd, thank you for choosing our office today! We appreciate the opportunity to meet your healthcare needs. You may receive a short survey by mail, e-mail, or through MyChart. If you are happy with your care we would appreciate if you could take just a few minutes to complete the survey questions. We read all of your comments and take your feedback very seriously. Thank you again for choosing our office.  [facility]  [facility] ([address] [place], [place] [address]) Entrance C, located off of [address] Free 24/7 valet parking   CLASSES: Go to [email or web address] to register for classes (childbirth, breastfeeding, waterbirth, infant CPR, daddy bootcamp, etc.)  Call the office ([phone]) or go to [facility] if: You begin to have strong, frequent contractions Your water breaks.  Sometimes it is a big gush of fluid, sometimes it is just a trickle that keeps getting your panties wet or running down your legs You have vaginal bleeding.  It is normal to have a small amount of spotting if your cervix was checked.  You don't feel your baby moving like normal.  If you don't, get you something to eat and drink and lay down and focus on feeling your baby move.   If your baby is still not moving like normal, you should call the office or go to [facility].  Call the office ([phone]) or go to [facility] for these signs of pre-eclampsia: Severe headache that does not go away with Tylenol Visual changes- seeing spots, double, blurred vision Pain under your right breast or upper abdomen that does not go away with Tums or heartburn medicine Nausea and/or vomiting Severe swelling in your hands, feet, and face   Tdap Vaccine It is recommended that you get the Tdap vaccine during the third trimester of EACH pregnancy to help protect your baby from getting pertussis (whooping cough) 27-36 weeks is the BEST time to do  this so that you can pass the protection on to your baby. During pregnancy is better than after pregnancy, but if you are unable to get it during pregnancy it will be offered at the hospital.  You can get this vaccine with us, at the health department, your family doctor, or some local pharmacies Everyone who will be around your baby should also be up-to-date on their vaccines before the baby comes. Adults (who are not pregnant) only need 1 dose of Tdap during adulthood.   [place] [facility] Doctors [facility] ([facility]): [address], [phone]           [facility]: [address], [phone]                [facility] ([facility]): [address], [phone] (call to ask if accepting patients) [facility]: [address], [place], [phone]    [place] [facility]/Family Doctors [facility] ([facility]): [address], [phone] [facility]: [address], [phone] [facility]: [address], [phone]  [place] [facility]  [facility] ([facility]): [phone] [facility]: [address], [phone]   [place] [facility]: [address], [phone]  [place] [facility]  [facility]: [address], [phone]  Home Blood Pressure Monitoring for Patients   Your provider has recommended that you check your   blood pressure (BP) at least once a week at home. If you do not have a blood pressure cuff at home, one will be provided for you. Contact your provider if you have not received your monitor within 1 week.   Helpful Tips for Accurate Home Blood Pressure Checks  Don't smoke, exercise, or drink caffeine 30 minutes before checking your BP Use the restroom before checking your BP (a full bladder can raise your  pressure) Relax in a comfortable upright chair Feet on the ground Left arm resting comfortably on a flat surface at the level of your heart Legs uncrossed Back supported Sit quietly and don't talk Place the cuff on your bare arm Adjust snuggly, so that only two fingertips can fit between your skin and the top of the cuff Check 2 readings separated by at least one minute Keep a log of your BP readings For a visual, please reference this diagram: http://ccnc.care/bpdiagram  Provider Name: [facility]     Phone: [phone]  Zone 1: ALL CLEAR  Continue to monitor your symptoms:  BP reading is less than 140 (top number) or less than 90 (bottom number)  No right upper stomach pain No headaches or seeing spots No feeling nauseated or throwing up No swelling in face and hands  Zone 2: CAUTION Call your doctor's office for any of the following:  BP reading is greater than 140 (top number) or greater than 90 (bottom number)  Stomach pain under your ribs in the middle or right side Headaches or seeing spots Feeling nauseated or throwing up Swelling in face and hands  Zone 3: EMERGENCY  Seek immediate medical care if you have any of the following:  BP reading is greater than160 (top number) or greater than 110 (bottom number) Severe headaches not improving with Tylenol Serious difficulty catching your breath Any worsening symptoms from Zone 2  Preterm Labor and Birth Information  The normal length of a pregnancy is 39-41 weeks. Preterm labor is when labor starts before 37 completed weeks of pregnancy. What are the risk factors for preterm labor? Preterm labor is more likely to occur in women who: Have certain infections during pregnancy such as a bladder infection, sexually transmitted infection, or infection inside the uterus (chorioamnionitis). Have a shorter-than-normal cervix. Have gone into preterm labor before. Have had surgery on their cervix. Are younger than age 17  or older than age 35. Are African American. Are pregnant with twins or multiple babies (multiple gestation). Take street drugs or smoke while pregnant. Do not gain enough weight while pregnant. Became pregnant shortly after having been pregnant. What are the symptoms of preterm labor? Symptoms of preterm labor include: Cramps similar to those that can happen during a menstrual period. The cramps may happen with diarrhea. Pain in the abdomen or lower back. Regular uterine contractions that may feel like tightening of the abdomen. A feeling of increased pressure in the pelvis. Increased watery or bloody mucus discharge from the vagina. Water breaking (ruptured amniotic sac). Why is it important to recognize signs of preterm labor? It is important to recognize signs of preterm labor because babies who are born prematurely may not be fully developed. This can put them at an increased risk for: Long-term (chronic) heart and lung problems. Difficulty immediately after birth with regulating body systems, including blood sugar, body temperature, heart rate, and breathing rate. Bleeding in the brain. Cerebral palsy. Learning difficulties. Death. These risks are highest for babies who are born before 34 weeks   of pregnancy. How is preterm labor treated? Treatment depends on the length of your pregnancy, your condition, and the health of your baby. It may involve: Having a stitch (suture) placed in your cervix to prevent your cervix from opening too early (cerclage). Taking or being given medicines, such as: Hormone medicines. These may be given early in pregnancy to help support the pregnancy. Medicine to stop contractions. Medicines to help mature the baby's lungs. These may be prescribed if the risk of delivery is high. Medicines to prevent your baby from developing cerebral palsy. If the labor happens before 34 weeks of pregnancy, you may need to stay in the hospital. What should I do if I  think I am in preterm labor? If you think that you are going into preterm labor, call your health care provider right away. How can I prevent preterm labor in future pregnancies? To increase your chance of having a full-term pregnancy: Do not use any tobacco products, such as cigarettes, chewing tobacco, and e-cigarettes. If you need help quitting, ask your health care provider. Do not use street drugs or medicines that have not been prescribed to you during your pregnancy. Talk with your health care provider before taking any herbal supplements, even if you have been taking them regularly. Make sure you gain a healthy amount of weight during your pregnancy. Watch for infection. If you think that you might have an infection, get it checked right away. Make sure to tell your health care provider if you have gone into preterm labor before. This information is not intended to replace advice given to you by your health care provider. Make sure you discuss any questions you have with your health care provider. Document Revised: 06/12/2018 Document Reviewed: 07/12/2015 Elsevier Patient Education  2020 Elsevier Inc.   

## 2020-12-25 ENCOUNTER — Other Ambulatory Visit: Payer: Self-pay | Admitting: Women's Health

## 2020-12-25 DIAGNOSIS — O24410 Gestational diabetes mellitus in pregnancy, diet controlled: Secondary | ICD-10-CM

## 2020-12-26 ENCOUNTER — Encounter: Payer: Self-pay | Admitting: Women's Health

## 2020-12-26 ENCOUNTER — Ambulatory Visit (INDEPENDENT_AMBULATORY_CARE_PROVIDER_SITE_OTHER): Payer: Medicaid Other | Admitting: Women's Health

## 2020-12-26 ENCOUNTER — Other Ambulatory Visit (HOSPITAL_COMMUNITY)
Admission: RE | Admit: 2020-12-26 | Discharge: 2020-12-26 | Disposition: A | Discharge status: Home / Self Care | Payer: Medicaid Other | Source: Ambulatory Visit | Attending: Women's Health | Admitting: Women's Health

## 2020-12-26 ENCOUNTER — Ambulatory Visit (INDEPENDENT_AMBULATORY_CARE_PROVIDER_SITE_OTHER): Payer: Medicaid Other

## 2020-12-26 ENCOUNTER — Other Ambulatory Visit: Payer: Self-pay

## 2020-12-26 VITALS — BP 115/70 | HR 88 | Wt 172.5 lb

## 2020-12-26 DIAGNOSIS — Z3A37 37 weeks gestation of pregnancy: Secondary | ICD-10-CM | POA: Diagnosis not present

## 2020-12-26 DIAGNOSIS — O24410 Gestational diabetes mellitus in pregnancy, diet controlled: Secondary | ICD-10-CM

## 2020-12-26 DIAGNOSIS — O0990 Supervision of high risk pregnancy, unspecified, unspecified trimester: Secondary | ICD-10-CM

## 2020-12-26 DIAGNOSIS — O0993 Supervision of high risk pregnancy, unspecified, third trimester: Secondary | ICD-10-CM

## 2020-12-26 LAB — POCT URINALYSIS DIPSTICK OB
Blood, UA: NEGATIVE
Glucose, UA: NEGATIVE
Ketones, UA: NEGATIVE
Leukocytes, UA: NEGATIVE
Nitrite, UA: NEGATIVE
POC,PROTEIN,UA: NEGATIVE

## 2020-12-26 LAB — OB RESULTS CONSOLE GC/CHLAMYDIA: Gonorrhea: NEGATIVE

## 2020-12-26 NOTE — Patient Instructions (Signed)
Zamariya, thank you for choosing our office today! We appreciate the opportunity to meet your healthcare needs. You may receive a short survey by mail, e-mail, or through MyChart. If you are happy with your care we would appreciate if you could take just a few minutes to complete the survey questions. We read all of your comments and take your feedback very seriously. Thank you again for choosing our office.  [facility]  [facility] ([address] [place], [place] [address]) Entrance C, located off of [address] Free 24/7 valet parking   CLASSES: Go to [email or web address] to register for classes (childbirth, breastfeeding, waterbirth, infant CPR, daddy bootcamp, etc.)  Call the office ([phone]) or go to [facility] if: You begin to have strong, frequent contractions Your water breaks.  Sometimes it is a big gush of fluid, sometimes it is just a trickle that keeps getting your panties wet or running down your legs You have vaginal bleeding.  It is normal to have a small amount of spotting if your cervix was checked.  You don't feel your baby moving like normal.  If you don't, get you something to eat and drink and lay down and focus on feeling your baby move.   If your baby is still not moving like normal, you should call the office or go to [facility].  Call the office ([phone]) or [facility] for these signs of pre-eclampsia: Severe headache that does not go away with Tylenol Visual changes- seeing spots, double, blurred vision Pain under your right breast or upper abdomen that does not go away with Tums or heartburn medicine Nausea and/or vomiting Severe swelling in your hands, feet, and face   [place] [facility]/Family Doctors [facility] ([facility]): [address], [phone]           [facility]: [address], [phone]                 [facility] ([facility]): [address], [phone] (call to ask if accepting patients) [facility]: [address], [place], [phone]    [place] [facility]/Family Doctors [facility] ([facility]): [address], [phone] [facility]: [address], [phone] [facility]: [address], [phone]  [place] [facility]  [facility] ([facility]): [phone] [facility]: [address], [phone]   [place] [facility]: [address], [phone]  [place] [facility]  [facility]: [address], [phone]  Home Blood Pressure Monitoring for Patients   Your provider has recommended that you check your blood pressure (BP) at least once a week at home. If you do not have a blood pressure cuff at home, one will be provided for you. Contact your provider if you have not received your monitor within 1 week.   Helpful Tips for Accurate Home Blood Pressure Checks  Don't smoke, exercise, or drink caffeine 30 minutes before checking your BP Use the restroom before checking your BP (a full bladder can raise your pressure) Relax in a comfortable upright chair Feet on the ground Left arm resting comfortably on a flat surface at the level of your heart Legs uncrossed Back supported Sit quietly and don't talk Place the cuff on your bare arm Adjust snuggly, so that only two fingertips   can fit between your skin and the top of the cuff Check 2 readings separated by at least one minute Keep a log of your BP readings For a visual, please reference this diagram: http://ccnc.care/bpdiagram  Provider Name: [facility]     Phone: [phone]  Zone 1: ALL CLEAR  Continue to monitor your symptoms:  BP reading is less than 140 (top number) or less than 90 (bottom number)  No right  upper stomach pain No headaches or seeing spots No feeling nauseated or throwing up No swelling in face and hands  Zone 2: CAUTION Call your doctor's office for any of the following:  BP reading is greater than 140 (top number) or greater than 90 (bottom number)  Stomach pain under your ribs in the middle or right side Headaches or seeing spots Feeling nauseated or throwing up Swelling in face and hands  Zone 3: EMERGENCY  Seek immediate medical care if you have any of the following:  BP reading is greater than160 (top number) or greater than 110 (bottom number) Severe headaches not improving with Tylenol Serious difficulty catching your breath Any worsening symptoms from Zone 2   Braxton Hicks Contractions Contractions of the uterus can occur throughout pregnancy, but they are not always a sign that you are in labor. You may have practice contractions called Braxton Hicks contractions. These false labor contractions are sometimes confused with true labor. What are [place] contractions? Braxton Hicks contractions are tightening movements that occur in the muscles of the uterus before labor. Unlike true labor contractions, these contractions do not result in opening (dilation) and thinning of the cervix. Toward the end of pregnancy (32-34 weeks), Braxton Hicks contractions can happen more often and may become stronger. These contractions are sometimes difficult to tell apart from true labor because they can be very uncomfortable. You should not feel embarrassed if you go to the hospital with false labor. Sometimes, the only way to tell if you are in true labor is for your health care provider to look for changes in the cervix. The health care provider will do a physical exam and may monitor your contractions. If you are not in true labor, the exam should show that your cervix is not dilating and your water has not broken. If there are no other health problems associated with your  pregnancy, it is completely safe for you to be sent home with false labor. You may continue to have Braxton Hicks contractions until you go into true labor. How to tell the difference between true labor and false labor True labor Contractions last 30-70 seconds. Contractions become very regular. Discomfort is usually felt in the top of the uterus, and it spreads to the lower abdomen and low back. Contractions do not go away with walking. Contractions usually become more intense and increase in frequency. The cervix dilates and gets thinner. False labor Contractions are usually shorter and not as strong as true labor contractions. Contractions are usually irregular. Contractions are often felt in the front of the lower abdomen and in the groin. Contractions may go away when you walk around or change positions while lying down. Contractions get weaker and are shorter-lasting as time goes on. The cervix usually does not dilate or become thin. Follow these instructions at home:  Take over-the-counter and prescription medicines only as told by your health care provider. Keep up with your usual exercises and follow other instructions from your health care provider. Eat and drink lightly if you think  you are going into labor. If Braxton Hicks contractions are making you uncomfortable: Change your position from lying down or resting to walking, or change from walking to resting. Sit and rest in a tub of warm water. Drink enough fluid to keep your urine pale yellow. Dehydration may cause these contractions. Do slow and deep breathing several times an hour. Keep all follow-up prenatal visits as told by your health care provider. This is important. Contact a health care provider if: You have a fever. You have continuous pain in your abdomen. Get help right away if: Your contractions become stronger, more regular, and closer together. You have fluid leaking or gushing from your vagina. You pass  blood-tinged mucus (bloody show). You have bleeding from your vagina. You have low back pain that you never had before. You feel your baby's head pushing down and causing pelvic pressure. Your baby is not moving inside you as much as it used to. Summary Contractions that occur before labor are called Braxton Hicks contractions, false labor, or practice contractions. Braxton Hicks contractions are usually shorter, weaker, farther apart, and less regular than true labor contractions. True labor contractions usually become progressively stronger and regular, and they become more frequent. Manage discomfort from [facility] contractions by changing position, resting in a warm bath, drinking plenty of water, or practicing deep breathing. This information is not intended to replace advice given to you by your health care provider. Make sure you discuss any questions you have with your health care provider. Document Revised: 01/31/2017 Document Reviewed: 07/04/2016 Elsevier Patient Education  [place].

## 2020-12-26 NOTE — Progress Notes (Signed)
[name] 37+3 wks,cephalic,fhr 135 bpm,AFI 11 cm,posterior placenta gr 3,simple right ovarian cyst 2.8 x 2.7 x 1.6 cm,normal left ovary,EFW 2739 g 18%,BPD 2%,HC 5.4%

## 2020-12-26 NOTE — Progress Notes (Signed)
HIGH-RISK PREGNANCY VISIT Patient name: Vickie Lloyd MRN [id number]  Date of birth: [birth year] Chief Complaint:   High Risk Gestation (GBS, GC/CHL)  History of Present Illness:   Vickie Lloyd is a 30 y.o. G67P2002 female at [redacted]w[redacted]d with an Estimated Date of Delivery: 01/13/21 being seen today for ongoing management of a high-risk pregnancy complicated by diabetes mellitus A1DM.    Today she reports  checking sugars BID  now (FBS and one 2hr pp) per LHE b/c all sugars have been normal since dx. All sugars wnl now . Contractions: Not present. Vag. Bleeding: None.  Movement: Present. denies leaking of fluid.   Depression screen [facility] 2/9 07/12/2020 09/30/2017 08/06/2017 11/18/2016 01/19/2016  Decreased Interest 0 0 0 0 0  Down, Depressed, Hopeless 0 0 0 0 0  PHQ - 2 Score 0 0 0 0 0  Altered sleeping 0 - - - -  Tired, decreased energy 0 - - - -  Change in appetite 0 - - - -  Feeling bad or failure about yourself  0 - - - -  Trouble concentrating 0 - - - -  Moving slowly or fidgety/restless 0 - - - -  Suicidal thoughts 0 - - - -  PHQ-9 Score 0 - - - -     GAD 7 : Generalized Anxiety Score 07/12/2020  Nervous, Anxious, on Edge 0  Control/stop worrying 0  Worry too much - different things 0  Trouble relaxing 0  Restless 0  Easily annoyed or irritable 0  Afraid - awful might happen 0  Total GAD 7 Score 0     Review of Systems:   Pertinent items are noted in HPI Denies abnormal vaginal discharge w/ itching/odor/irritation, headaches, visual changes, shortness of breath, chest pain, abdominal pain, severe nausea/vomiting, or problems with urination or bowel movements unless otherwise stated above. Pertinent History Reviewed:  Reviewed past medical,surgical, social, obstetrical and family history.  Reviewed problem list, medications and allergies. Physical Assessment:   Vitals:   12/26/20 1625  BP: 115/70  Pulse: 88  Weight: 172 lb 8 oz (78.2 kg)  Body mass index is 28.71 kg/m.            Physical Examination:   General appearance: alert, well appearing, and in no distress  Mental status: alert, oriented to person, place, and time  Skin: warm & dry   Extremities: Edema: None    Cardiovascular: normal heart rate noted  Respiratory: normal respiratory effort, no distress  Abdomen: gravid, soft, non-tender  Pelvic: Cervical exam performed  Dilation: 1 Effacement (%): Thick Station: -2  Fetal Status: Fetal Heart Rate (bpm): 135 u/s   Movement: Present Presentation: Vertex  Fetal Surveillance Testing today: [name] 37+3 wks,cephalic,fhr 135 bpm,AFI 11 cm,posterior placenta gr 3,simple right ovarian cyst 2.8 x 2.7 x 1.6 cm,normal left ovary,EFW 2739 g 18%,BPD 2%,HC 5.4%  Chaperone: Malachy Mood    Results for orders placed or performed in visit on 12/26/20 (from the past 24 hour(s))  POC Urinalysis Dipstick OB   Collection Time: 12/26/20  4:33 PM  Result Value Ref Range   Color, UA     Clarity, UA     Glucose, UA Negative Negative   Bilirubin, UA     Ketones, UA neg    Spec Grav, UA     Blood, UA neg    pH, UA     POC,PROTEIN,UA Negative Negative, Trace, Small (1+), Moderate (2+), Large (3+), 4+   Urobilinogen, UA  Nitrite, UA neg    Leukocytes, UA Negative Negative   Appearance     Odor      Assessment & Plan:  High-risk pregnancy: G3P2002 at [redacted]w[redacted]d with an Estimated Date of Delivery: 01/13/21   1) A1DM, stable, all sugars wnl since dx, EFW today 18%. Had discussed previously w/ LHE, still recommends IOL 39-40wks. Pt prefers 39wks.   Meds: No orders of the defined types were placed in this encounter.   Labs/procedures today: GBS, GC/CT, SVE, and U/S  Treatment Plan:  IOL @ 39-40wks  Reviewed: Term labor symptoms and general obstetric precautions including but not limited to vaginal bleeding, contractions, leaking of fluid and fetal movement were reviewed in detail with the patient.  All questions were answered. Does have home bp cuff. Office bp cuff given:  not applicable. Check bp weekly, let us know if consistently >140 and/or >90.  Follow-up: Return in about 1 week (around 01/02/2021) for HROB, MD or CNM, in person.   No future appointments.  Orders Placed This Encounter  Procedures   Strep Gp B NAA   POC Urinalysis Dipstick OB   Cheral Marker CNM, [facility] 12/26/2020 4:46 PM

## 2020-12-28 LAB — SPECIMEN STATUS REPORT

## 2020-12-28 LAB — STREP GP B NAA: Strep Gp B NAA: POSITIVE — AB

## 2020-12-28 LAB — CERVICOVAGINAL ANCILLARY ONLY
Chlamydia: NEGATIVE
Comment: NEGATIVE
Comment: NORMAL
Neisseria Gonorrhea: NEGATIVE

## 2021-01-02 ENCOUNTER — Other Ambulatory Visit: Payer: Self-pay

## 2021-01-02 ENCOUNTER — Encounter: Payer: Self-pay | Admitting: Obstetrics & Gynecology

## 2021-01-02 ENCOUNTER — Ambulatory Visit (INDEPENDENT_AMBULATORY_CARE_PROVIDER_SITE_OTHER): Payer: Medicaid Other | Admitting: Obstetrics & Gynecology

## 2021-01-02 VITALS — BP 113/72 | HR 89 | Wt 174.5 lb

## 2021-01-02 DIAGNOSIS — O0990 Supervision of high risk pregnancy, unspecified, unspecified trimester: Secondary | ICD-10-CM

## 2021-01-02 DIAGNOSIS — O24410 Gestational diabetes mellitus in pregnancy, diet controlled: Secondary | ICD-10-CM

## 2021-01-02 NOTE — Progress Notes (Signed)
   LOW-RISK PREGNANCY VISIT Patient name: Vickie Lloyd MRN [id number]  Date of birth: [birth year] Chief Complaint:   High Risk Gestation  History of Present Illness:   Vickie Lloyd is a 30 y.o. G96P2002 female at [redacted]w[redacted]d with an Estimated Date of Delivery: 01/13/21 being seen today for ongoing management of a low-risk pregnancy.  Depression screen [facility] 2/9 07/12/2020 09/30/2017 08/06/2017 11/18/2016 01/19/2016  Decreased Interest 0 0 0 0 0  Down, Depressed, Hopeless 0 0 0 0 0  PHQ - 2 Score 0 0 0 0 0  Altered sleeping 0 - - - -  Tired, decreased energy 0 - - - -  Change in appetite 0 - - - -  Feeling bad or failure about yourself  0 - - - -  Trouble concentrating 0 - - - -  Moving slowly or fidgety/restless 0 - - - -  Suicidal thoughts 0 - - - -  PHQ-9 Score 0 - - - -    Today she reports no complaints. Contractions: Irritability. Vag. Bleeding: None.  Movement: Present. denies leaking of fluid. Review of Systems:   Pertinent items are noted in HPI Denies abnormal vaginal discharge w/ itching/odor/irritation, headaches, visual changes, shortness of breath, chest pain, abdominal pain, severe nausea/vomiting, or problems with urination or bowel movements unless otherwise stated above. Pertinent History Reviewed:  Reviewed past medical,surgical, social, obstetrical and family history.  Reviewed problem list, medications and allergies. Physical Assessment:   Vitals:   01/02/21 1509  BP: 113/72  Pulse: 89  Weight: 174 lb 8 oz (79.2 kg)  Body mass index is 29.04 kg/m.        Physical Examination:   General appearance: Well appearing, and in no distress  Mental status: Alert, oriented to person, place, and time  Skin: Warm & dry  Cardiovascular: Normal heart rate noted  Respiratory: Normal respiratory effort, no distress  Abdomen: Soft, gravid, nontender  Pelvic: Cervical exam performed  Dilation: 1.5 Effacement (%): Thick Station: -3  Extremities: Edema: None  Fetal Status: Fetal  Heart Rate (bpm): 130 Fundal Height: 36 cm Movement: Present Presentation: Vertex  Chaperone: n/a    No results found for this or any previous visit (from the past 24 hour(s)).  Assessment & Plan:  1) High risk pregnancy G3P2002 at [redacted]w[redacted]d with an Estimated Date of Delivery: 01/13/21   2) A1DM well controlled,    Meds: No orders of the defined types were placed in this encounter.  Labs/procedures today:   Plan:  Continue routine obstetrical care  Next visit: prefers in person    Reviewed: Term labor symptoms and general obstetric precautions including but not limited to vaginal bleeding, contractions, leaking of fluid and fetal movement were reviewed in detail with the patient.  All questions were answered.  home bp cuff. Rx faxed to . Check bp weekly, let us know if >140/90.   Follow-up: Return in about 1 week (around 01/09/2021) for HROB.  No orders of the defined types were placed in this encounter.   Lazaro Arms, MD 01/02/2021 3:29 PM

## 2021-01-09 ENCOUNTER — Encounter: Payer: Self-pay | Admitting: *Deleted

## 2021-01-10 ENCOUNTER — Other Ambulatory Visit: Payer: Self-pay | Admitting: Advanced Practice Midwife

## 2021-01-10 ENCOUNTER — Other Ambulatory Visit: Payer: Self-pay

## 2021-01-10 ENCOUNTER — Ambulatory Visit (INDEPENDENT_AMBULATORY_CARE_PROVIDER_SITE_OTHER): Payer: Medicaid Other | Admitting: Advanced Practice Midwife

## 2021-01-10 VITALS — BP 112/77 | HR 77 | Wt 174.0 lb

## 2021-01-10 DIAGNOSIS — Z3A39 39 weeks gestation of pregnancy: Secondary | ICD-10-CM

## 2021-01-10 DIAGNOSIS — B951 Streptococcus, group B, as the cause of diseases classified elsewhere: Secondary | ICD-10-CM | POA: Insufficient documentation

## 2021-01-10 DIAGNOSIS — O0990 Supervision of high risk pregnancy, unspecified, unspecified trimester: Secondary | ICD-10-CM

## 2021-01-10 NOTE — Progress Notes (Signed)
HIGH-RISK PREGNANCY VISIT Patient name: Vickie Lloyd MRN [id number]  Date of birth: 17-May-1990 Chief Complaint:   Routine Prenatal Visit  History of Present Illness:   Vickie Lloyd is a 30 y.o. G75P2002 female at [redacted]w[redacted]d with an Estimated Date of Delivery: 01/13/21 being seen today for ongoing management of a high-risk pregnancy complicated by diabetes mellitus A1DM (all sugars nl without diet changes).  Today she reports no complaints. Contractions: Irritability. Vag. Bleeding: None.  Movement: Present. denies leaking of fluid.   Depression screen [facility] 2/9 07/12/2020 09/30/2017 08/06/2017 11/18/2016 01/19/2016  Decreased Interest 0 0 0 0 0  Down, Depressed, Hopeless 0 0 0 0 0  PHQ - 2 Score 0 0 0 0 0  Altered sleeping 0 - - - -  Tired, decreased energy 0 - - - -  Change in appetite 0 - - - -  Feeling bad or failure about yourself  0 - - - -  Trouble concentrating 0 - - - -  Moving slowly or fidgety/restless 0 - - - -  Suicidal thoughts 0 - - - -  PHQ-9 Score 0 - - - -     GAD 7 : Generalized Anxiety Score 07/12/2020  Nervous, Anxious, on Edge 0  Control/stop worrying 0  Worry too much - different things 0  Trouble relaxing 0  Restless 0  Easily annoyed or irritable 0  Afraid - awful might happen 0  Total GAD 7 Score 0     Review of Systems:   Pertinent items are noted in HPI Denies abnormal vaginal discharge w/ itching/odor/irritation, headaches, visual changes, shortness of breath, chest pain, abdominal pain, severe nausea/vomiting, or problems with urination or bowel movements unless otherwise stated above. Pertinent History Reviewed:  Reviewed past medical,surgical, social, obstetrical and family history.  Reviewed problem list, medications and allergies. Physical Assessment:   Vitals:   01/10/21 1102  BP: 112/77  Pulse: 77  Weight: 174 lb (78.9 kg)  Body mass index is 28.96 kg/m.           Physical Examination:   General appearance: alert, well appearing, and in  no distress  Mental status: alert, oriented to person, place, and time  Skin: warm & dry   Extremities: Edema: None    Cardiovascular: normal heart rate noted  Respiratory: normal respiratory effort, no distress  Abdomen: gravid, soft, non-tender  Pelvic: Cervical exam performed  Dilation: 1 Effacement (%): Thick Station: -2  Fetal Status: Fetal Heart Rate (bpm): 132 Fundal Height: 36 cm Movement: Present Presentation: Vertex  Fetal Surveillance Testing today: doppler    No results found for this or any previous visit (from the past 24 hour(s)).  Assessment & Plan:  High-risk pregnancy: G3P2002 at [redacted]w[redacted]d with an Estimated Date of Delivery: 01/13/21   1) GDMA1, stable; EFW 18% at 37wks;  IOL form faxed via Epic and orders placed (for 11/12 @ [facility])  2) GBS+, ppx in labor  Meds: No orders of the defined types were placed in this encounter.   Labs/procedures today:  membrane sweep with SVE  Treatment Plan:  IOL 01/13/21 at [facility]  Reviewed: Term labor symptoms and general obstetric precautions including but not limited to vaginal bleeding, contractions, leaking of fluid and fetal movement were reviewed in detail with the patient.  All questions were answered. Does have home bp cuff. Office bp cuff given: not applicable. Check bp daily, let us know if consistently >140 and/or >90.  Follow-up: Return in about 6 weeks (around  02/21/2021) for postpartum visit.   Future Appointments  Date Time Provider [place]  01/13/2021 12:00 AM [facility] SCHED ROOM [facility] None    No orders of the defined types were placed in this encounter.  Myrtis Ser CNM 01/10/2021 11:31 AM

## 2021-01-10 NOTE — Patient Instructions (Addendum)
Your induction is scheduled for January 13, 2021. You should be there on November 11 at 11:45pm  Go to the main desk at the [facility] and let them know you are there to be induced. They will send someone from Labor & Delivery to come get you.  You will get a call from a nurse from the hospital within the next day or so to go over some information, she will also schedule you to go to the hospital a few days before you induction to have your Covid test. If you have any questions, please let us know.    Try these positions to see if they help labor start:  [email or web address]

## 2021-01-13 ENCOUNTER — Inpatient Hospital Stay (HOSPITAL_COMMUNITY): Payer: Medicaid Other

## 2021-01-13 ENCOUNTER — Encounter (HOSPITAL_COMMUNITY): Payer: Self-pay | Admitting: Obstetrics & Gynecology

## 2021-01-13 ENCOUNTER — Inpatient Hospital Stay (HOSPITAL_COMMUNITY)
Admission: AD | Admit: 2021-01-13 | Discharge: 2021-01-14 | DRG: 807 | Disposition: A | Discharge status: Home / Self Care | Payer: Medicaid Other | Attending: Obstetrics and Gynecology | Admitting: Obstetrics and Gynecology

## 2021-01-13 ENCOUNTER — Other Ambulatory Visit: Payer: Self-pay

## 2021-01-13 ENCOUNTER — Inpatient Hospital Stay (HOSPITAL_COMMUNITY): Payer: Medicaid Other | Admitting: Anesthesiology

## 2021-01-13 DIAGNOSIS — O99824 Streptococcus B carrier state complicating childbirth: Secondary | ICD-10-CM | POA: Diagnosis not present

## 2021-01-13 DIAGNOSIS — O24410 Gestational diabetes mellitus in pregnancy, diet controlled: Secondary | ICD-10-CM | POA: Diagnosis not present

## 2021-01-13 DIAGNOSIS — Z23 Encounter for immunization: Secondary | ICD-10-CM

## 2021-01-13 DIAGNOSIS — O480 Post-term pregnancy: Secondary | ICD-10-CM | POA: Diagnosis not present

## 2021-01-13 DIAGNOSIS — O24429 Gestational diabetes mellitus in childbirth, unspecified control: Secondary | ICD-10-CM | POA: Diagnosis not present

## 2021-01-13 DIAGNOSIS — B951 Streptococcus, group B, as the cause of diseases classified elsewhere: Secondary | ICD-10-CM | POA: Diagnosis present

## 2021-01-13 DIAGNOSIS — Z20822 Contact with and (suspected) exposure to covid-19: Secondary | ICD-10-CM | POA: Diagnosis present

## 2021-01-13 DIAGNOSIS — Z3A40 40 weeks gestation of pregnancy: Secondary | ICD-10-CM | POA: Diagnosis not present

## 2021-01-13 DIAGNOSIS — O09899 Supervision of other high risk pregnancies, unspecified trimester: Secondary | ICD-10-CM

## 2021-01-13 DIAGNOSIS — O99820 Streptococcus B carrier state complicating pregnancy: Secondary | ICD-10-CM | POA: Diagnosis not present

## 2021-01-13 DIAGNOSIS — O24420 Gestational diabetes mellitus in childbirth, diet controlled: Principal | ICD-10-CM | POA: Diagnosis present

## 2021-01-13 DIAGNOSIS — O0990 Supervision of high risk pregnancy, unspecified, unspecified trimester: Secondary | ICD-10-CM

## 2021-01-13 LAB — TYPE AND SCREEN
ABO/RH(D): A POS
Antibody Screen: NEGATIVE

## 2021-01-13 LAB — GLUCOSE, CAPILLARY
Glucose-Capillary: 79 mg/dL (ref 70–99)
Glucose-Capillary: 81 mg/dL (ref 70–99)

## 2021-01-13 LAB — CBC
HCT: 31 % — ABNORMAL LOW (ref 36.0–46.0)
Hemoglobin: 10.1 g/dL — ABNORMAL LOW (ref 12.0–15.0)
MCH: 25.9 pg — ABNORMAL LOW (ref 26.0–34.0)
MCHC: 32.6 g/dL (ref 30.0–36.0)
MCV: 79.5 fL — ABNORMAL LOW (ref 80.0–100.0)
Platelets: 274 10*3/uL (ref 150–400)
RBC: 3.9 MIL/uL (ref 3.87–5.11)
RDW: 14 % (ref 11.5–15.5)
WBC: 11.4 10*3/uL — ABNORMAL HIGH (ref 4.0–10.5)
nRBC: 0 % (ref 0.0–0.2)

## 2021-01-13 LAB — RESP PANEL BY RT-PCR (FLU A&B, COVID) ARPGX2
Influenza A by PCR: NEGATIVE
Influenza B by PCR: NEGATIVE
SARS Coronavirus 2 by RT PCR: NEGATIVE

## 2021-01-13 LAB — RPR: RPR Ser Ql: NONREACTIVE

## 2021-01-13 MED ORDER — LACTATED RINGERS IV SOLN
500.0000 mL | Freq: Once | INTRAVENOUS | Status: AC
  Administered 2021-01-13: 500 mL via INTRAVENOUS

## 2021-01-13 MED ORDER — PHENYLEPHRINE 40 MCG/ML (10ML) SYRINGE FOR IV PUSH (FOR BLOOD PRESSURE SUPPORT): 80.0000 ug | PREFILLED_SYRINGE | INTRAVENOUS | Status: DC | PRN

## 2021-01-13 MED ORDER — DIPHENHYDRAMINE HCL 50 MG/ML IJ SOLN: 12.5000 mg | INTRAMUSCULAR | Status: DC | PRN

## 2021-01-13 MED ORDER — LIDOCAINE HCL (PF) 1 % IJ SOLN
INTRAMUSCULAR | Status: DC | PRN
  Administered 2021-01-13 (×2): 4 mL via EPIDURAL

## 2021-01-13 MED ORDER — SOD CITRATE-CITRIC ACID 500-334 MG/5ML PO SOLN: 30.0000 mL | ORAL | Status: DC | PRN

## 2021-01-13 MED ORDER — MISOPROSTOL 50MCG HALF TABLET
ORAL_TABLET | ORAL | Status: AC
  Filled 2021-01-13: qty 1

## 2021-01-13 MED ORDER — LACTATED RINGERS IV SOLN: 500.0000 mL | Freq: Once | INTRAVENOUS | Status: DC

## 2021-01-13 MED ORDER — LACTATED RINGERS IV SOLN
500.0000 mL | INTRAVENOUS | Status: DC | PRN
Start: 2021-01-13 — End: 2021-01-13

## 2021-01-13 MED ORDER — OXYCODONE-ACETAMINOPHEN 5-325 MG PO TABS: 2.0000 | ORAL_TABLET | ORAL | Status: DC | PRN

## 2021-01-13 MED ORDER — IBUPROFEN 600 MG PO TABS
600.0000 mg | ORAL_TABLET | Freq: Four times a day (QID) | ORAL | Status: DC
  Administered 2021-01-13 – 2021-01-14 (×5): 600 mg via ORAL
  Filled 2021-01-13 (×5): qty 1

## 2021-01-13 MED ORDER — TERBUTALINE SULFATE 1 MG/ML IJ SOLN: 0.2500 mg | Freq: Once | INTRAMUSCULAR | Status: DC | PRN

## 2021-01-13 MED ORDER — OXYCODONE-ACETAMINOPHEN 5-325 MG PO TABS: 1.0000 | ORAL_TABLET | ORAL | Status: DC | PRN

## 2021-01-13 MED ORDER — EPHEDRINE 5 MG/ML INJ: 10.0000 mg | INTRAVENOUS | Status: DC | PRN

## 2021-01-13 MED ORDER — ACETAMINOPHEN 325 MG PO TABS: 650.0000 mg | ORAL_TABLET | ORAL | Status: DC | PRN

## 2021-01-13 MED ORDER — MISOPROSTOL 25 MCG QUARTER TABLET: 25.0000 ug | ORAL_TABLET | ORAL | Status: DC | PRN

## 2021-01-13 MED ORDER — SIMETHICONE 80 MG PO CHEW: 80.0000 mg | CHEWABLE_TABLET | ORAL | Status: DC | PRN

## 2021-01-13 MED ORDER — WITCH HAZEL-GLYCERIN EX PADS: 1.0000 "application " | MEDICATED_PAD | CUTANEOUS | Status: DC | PRN

## 2021-01-13 MED ORDER — BENZOCAINE-MENTHOL 20-0.5 % EX AERO
1.0000 "application " | INHALATION_SPRAY | CUTANEOUS | Status: DC | PRN
  Administered 2021-01-13: 1 via TOPICAL
  Filled 2021-01-13: qty 56

## 2021-01-13 MED ORDER — OXYTOCIN-SODIUM CHLORIDE 30-0.9 UT/500ML-% IV SOLN
2.5000 [IU]/h | INTRAVENOUS | Status: DC
  Filled 2021-01-13: qty 500

## 2021-01-13 MED ORDER — DIPHENHYDRAMINE HCL 25 MG PO CAPS: 25.0000 mg | ORAL_CAPSULE | Freq: Four times a day (QID) | ORAL | Status: DC | PRN

## 2021-01-13 MED ORDER — COCONUT OIL OIL: 1.0000 "application " | TOPICAL_OIL | Status: DC | PRN

## 2021-01-13 MED ORDER — FENTANYL CITRATE (PF) 100 MCG/2ML IJ SOLN
100.0000 ug | INTRAMUSCULAR | Status: DC | PRN
  Administered 2021-01-13 (×2): 100 ug via INTRAVENOUS
  Filled 2021-01-13 (×2): qty 2

## 2021-01-13 MED ORDER — MISOPROSTOL 50MCG HALF TABLET
50.0000 ug | ORAL_TABLET | ORAL | Status: DC | PRN
  Administered 2021-01-13: 50 ug via BUCCAL

## 2021-01-13 MED ORDER — PHENYLEPHRINE 40 MCG/ML (10ML) SYRINGE FOR IV PUSH (FOR BLOOD PRESSURE SUPPORT)
80.0000 ug | PREFILLED_SYRINGE | INTRAVENOUS | Status: DC | PRN
  Filled 2021-01-13: qty 10

## 2021-01-13 MED ORDER — PRENATAL MULTIVITAMIN CH
1.0000 | ORAL_TABLET | Freq: Every day | ORAL | Status: DC
  Administered 2021-01-14: 1 via ORAL
  Filled 2021-01-13: qty 1

## 2021-01-13 MED ORDER — ONDANSETRON HCL 4 MG/2ML IJ SOLN
4.0000 mg | Freq: Four times a day (QID) | INTRAMUSCULAR | Status: DC | PRN
  Administered 2021-01-13: 4 mg via INTRAVENOUS
  Filled 2021-01-13: qty 2

## 2021-01-13 MED ORDER — ONDANSETRON HCL 4 MG PO TABS: 4.0000 mg | ORAL_TABLET | ORAL | Status: DC | PRN

## 2021-01-13 MED ORDER — ONDANSETRON HCL 4 MG/2ML IJ SOLN: 4.0000 mg | INTRAMUSCULAR | Status: DC | PRN

## 2021-01-13 MED ORDER — LIDOCAINE HCL (PF) 1 % IJ SOLN: 30.0000 mL | INTRAMUSCULAR | Status: DC | PRN

## 2021-01-13 MED ORDER — SENNOSIDES-DOCUSATE SODIUM 8.6-50 MG PO TABS
2.0000 | ORAL_TABLET | Freq: Every day | ORAL | Status: DC
  Administered 2021-01-14: 2 via ORAL
  Filled 2021-01-13: qty 2

## 2021-01-13 MED ORDER — FENTANYL-BUPIVACAINE-NACL 0.5-0.125-0.9 MG/250ML-% EP SOLN
12.0000 mL/h | EPIDURAL | Status: DC | PRN
  Administered 2021-01-13: 12 mL/h via EPIDURAL
  Filled 2021-01-13: qty 250

## 2021-01-13 MED ORDER — OXYTOCIN BOLUS FROM INFUSION
333.0000 mL | Freq: Once | INTRAVENOUS | Status: AC
  Administered 2021-01-13: 333 mL via INTRAVENOUS

## 2021-01-13 MED ORDER — ACETAMINOPHEN 325 MG PO TABS
650.0000 mg | ORAL_TABLET | ORAL | Status: DC | PRN
  Administered 2021-01-13 – 2021-01-14 (×2): 650 mg via ORAL
  Filled 2021-01-13 (×2): qty 2

## 2021-01-13 MED ORDER — SODIUM CHLORIDE 0.9 % IV SOLN
5.0000 10*6.[IU] | Freq: Once | INTRAVENOUS | Status: AC
  Administered 2021-01-13: 5 10*6.[IU] via INTRAVENOUS
  Filled 2021-01-13: qty 5

## 2021-01-13 MED ORDER — TETANUS-DIPHTH-ACELL PERTUSSIS 5-2.5-18.5 LF-MCG/0.5 IM SUSY: 0.5000 mL | PREFILLED_SYRINGE | Freq: Once | INTRAMUSCULAR | Status: DC

## 2021-01-13 MED ORDER — LACTATED RINGERS IV SOLN: INTRAVENOUS | Status: DC

## 2021-01-13 MED ORDER — DIBUCAINE (PERIANAL) 1 % EX OINT: 1.0000 "application " | TOPICAL_OINTMENT | CUTANEOUS | Status: DC | PRN

## 2021-01-13 MED ORDER — IBUPROFEN 600 MG PO TABS: 600.0000 mg | ORAL_TABLET | Freq: Once | ORAL | Status: DC

## 2021-01-13 MED ORDER — PENICILLIN G POT IN DEXTROSE 60000 UNIT/ML IV SOLN
3.0000 10*6.[IU] | INTRAVENOUS | Status: DC
  Administered 2021-01-13: 3 10*6.[IU] via INTRAVENOUS
  Filled 2021-01-13 (×3): qty 50

## 2021-01-13 NOTE — Lactation Note (Signed)
This note was copied from a baby's chart. Lactation Consultation Note  Patient Name: Vickie Lloyd [id number] Date: 01/13/2021 Reason for consult: Initial assessment;1st time breastfeeding;Term;Maternal endocrine disorder Age:30 hours  LC in to visit with P3 Mom of term infant  This is Mom's first time breastfeeding.   Mom was a diet controlled GDM.    RN assisted with baby latching and was able to help baby latch using a 20 mm nipple shield.  Mom states baby stayed latched and fed for 10 mins.  NS had colostrum drops inside.    Baby swaddled in Mom's arms when entered room.  Lab had just come and drawn the blood.  LC recommended STS on Mom's chest to maintain baby's temperature and blood sugars and stimulate baby to want to latch to breast.   Reviewed hand expression and massage, colostrum easily expressed.  Mom has a hand pump.  Asked Mom to call out at desk for lactation to assess baby's latch at next feeding.  Explained the need to be double pumping when using a nipple shield.  [employer] came in with food.  Mom asked for baby to be held by [facility] so she can eat and then nap.  Swaddled baby for [employer]MO[employer] to hold.  Encouraged STS when Mom awakens.  Lactation brochure provided.  Maternal Data Has patient been taught Hand Expression?: Yes Does the patient have breastfeeding experience prior to this delivery?: No  Feeding Mother's Current Feeding Choice: Breast Milk  LATCH Score Latch: Repeated attempts needed to sustain latch, nipple held in mouth throughout feeding, stimulation needed to elicit sucking reflex.  Audible Swallowing: None  Type of Nipple: Everted at rest and after stimulation (semi compressible areolas)  Comfort (Breast/Nipple): Filling, red/small blisters or bruises, mild/mod discomfort  Hold (Positioning): Assistance needed to correctly position infant at breast and maintain latch.  LATCH Score: 5   Lactation Tools Discussed/Used Tools: Nipple Dorris Carnes;Pump Nipple  shield size: 20 Breast pump type: Manual Pump Education: Setup, frequency, and cleaning Reason for Pumping: Evert nipple prior to nipple shield application and to support milk supply  Interventions Interventions: Breast feeding basics reviewed;Skin to skin;Breast massage;Hand express;Pre-pump if needed;Hand pump;LC Services brochure  Discharge Pump: Personal Chiropractor DEBP)  Consult Status Consult Status: Follow-up Date: 01/14/21 Follow-up type: In-patient    Judee Clara 01/13/2021, 1:27 PM

## 2021-01-13 NOTE — Lactation Note (Signed)
This note was copied from a baby's chart. Lactation Consultation Note  Patient Name: Vickie Lloyd [id number] Date: 01/13/2021 Reason for consult: L&D Initial assessment;1st time breastfeeding;Term;Maternal endocrine disorder;Other (Comment);Breastfeeding assistance (LC LD visit at 35 mins PP, baby wide awake, rooting, STS with mom. Bbay latched 2 different attempts for a few strong sucks off/ did not sustain the latch. Tried both breast. Some areola edema / Rt> LT/ short shaft nipples/ more compressiable on the LT.) Age:20 mins - Latch score 5  Maternal Data Does the patient have breastfeeding experience prior to this delivery?: No  Feeding Mother's Current Feeding Choice: Breast Milk  LATCH Score Latch: Repeated attempts needed to sustain latch, nipple held in mouth throughout feeding, stimulation needed to elicit sucking reflex.  Audible Swallowing: None  Type of Nipple: Everted at rest and after stimulation (semi compressible areolas)  Comfort (Breast/Nipple): Filling, red/small blisters or bruises, mild/mod discomfort  Hold (Positioning): Assistance needed to correctly position infant at breast and maintain latch.  LATCH Score: 5   Lactation Tools Discussed/Used    Interventions Interventions: Breast feeding basics reviewed;Assisted with latch;Skin to skin;Breast massage;Reverse pressure;Breast compression;Adjust position;Support pillows;LC Services brochure  Discharge    Consult Status Consult Status: Follow-up from L&D Date: 01/13/21 Follow-up type: In-patient    Vickie Lloyd 01/13/2021, 10:57 AM

## 2021-01-13 NOTE — Anesthesia Preprocedure Evaluation (Signed)
Anesthesia Evaluation  Patient identified by MRN, date of birth, ID band Patient awake    Reviewed: Allergy & Precautions, Patient's Chart, lab work & pertinent test results  History of Anesthesia Complications Negative for: history of anesthetic complications  Airway Mallampati: II  TM Distance: >3 FB Neck ROM: Full    Dental no notable dental hx.    Pulmonary neg pulmonary ROS,    Pulmonary exam normal        Cardiovascular negative cardio ROS Normal cardiovascular exam     Neuro/Psych negative neurological ROS  negative psych ROS   GI/Hepatic negative GI ROS, Neg liver ROS,   Endo/Other  diabetes, Gestational  Renal/GU negative Renal ROS  negative genitourinary   Musculoskeletal negative musculoskeletal ROS (+)   Abdominal   Peds  Hematology negative hematology ROS (+)   Anesthesia Other Findings Day of surgery medications reviewed with patient.  Reproductive/Obstetrics (+) Pregnancy                             Anesthesia Physical Anesthesia Plan  ASA: 2  Anesthesia Plan: Epidural   Post-op Pain Management:    Induction:   PONV Risk Score and Plan: Treatment may vary due to age or medical condition  Airway Management Planned: Natural Airway  Additional Equipment:   Intra-op Plan:   Post-operative Plan:   Informed Consent: I have reviewed the patients History and Physical, chart, labs and discussed the procedure including the risks, benefits and alternatives for the proposed anesthesia with the patient or authorized representative who has indicated his/her understanding and acceptance.       Plan Discussed with:   Anesthesia Plan Comments:         Anesthesia Quick Evaluation

## 2021-01-13 NOTE — Discharge Instructions (Signed)

## 2021-01-13 NOTE — Progress Notes (Signed)
Labor Progress Note Vickie Lloyd is a 30 y.o. G3P2002 at [redacted]w[redacted]d presented for IOL for A1GDM  S:  Patient more comfortable with epidural, feeling burning at foley catheter site  O:  BP 101/62   Pulse 80   Temp 98.1 F (36.7 C) (Axillary)   Resp 15   Ht 5\' 5"  (1.651 m)   Wt 79.5 kg   LMP 04/08/2020   SpO2 100%   BMI 29.15 kg/m   Fetal Tracing:  Baseline: 135 Variability: moderate Accels: none Decels: early   Toco: 1-2   CVE: Dilation: 6 Effacement (%): 70 Station: -3 Presentation: Vertex Exam by:: [email or web address], CNM   A&P: 30 y.o. 26 [redacted]w[redacted]d IOL A1GDM #Labor: Progressing well. Discussed with patient risks and benefits of AROM for augmentation of labor. Patient agreeable to plan of care. AROM with moderate amount of clear fluid. Patient and FHR tolerated procedure well.  Patient is contracting frequently and making progress. Will manage expectantly post AROM and reassess in 2-3 hours  #Pain: epidural #FWB: Cat 1 #GBS positive PCN  [redacted]w[redacted]d, CNM 6:31 AM

## 2021-01-13 NOTE — H&P (Signed)
OBSTETRIC ADMISSION HISTORY AND PHYSICAL  Vickie Lloyd is a 30 y.o. female G60P2002 with IUP at 74w0dby LMP presenting for IOL A1GDM. She reports +FMs, No LOF, no VB, no blurry vision, headaches or peripheral edema, and RUQ pain.  She plans on breast feeding. She request IUD for birth control. [facility]  Dating: By LMP --->  Estimated Date of Delivery: 01/13/21  Sono:    _0 , CWD, normal anatomy, cephalic presentation, 24854O 18% EFW    FAMILY TREE  RESULTS  Language English Pap 08/03/20  Initiated care at 13wk GC/CT Initial: -/-           36wks:  -/-  Dating by LMP c/w     Support person  Genetics NT/IT: neg    Panorama: low-risk female  BP cuff Has bp cuff Carrier Screen neg    [place]/Hgb Elec neg  Rhogam     TDaP vaccine 11/21/20 Blood Type A/Positive/-- (05/11 1055)  Flu vaccine Declined 11/21/20 Antibody Negative (05/11 1055)  Covid vaccine 2021 HBsAg Negative (05/11 1055)    RPR Non Reactive (05/11 1055)  Anatomy [place]Normal female '[place]' Rubella  <0.90 (05/11 1055)  Feeding Plan breast HIV Non Reactive (05/11 1055)  Contraception Considering outpt IUD Hep C neg  Circumcision n/a    Pediatrician [facility] A1C/GTT     26-28wks: 74/196/125  Prenatal Classes discussed      GBS  POS   _1  PCN allergy  BTL Consent     VBAC Consent n/a PHQ9 & GAD7  _2 New OB  _3 28wks   _4 36wks  Waterbirth _5 Class _6  36wkCNM visit/consent     Prenatal History/Complications: GBS pos, rubella non immune  Past Medical History: Past Medical History:  Diagnosis Date   Diabetes mellitus without complication ([facility] [place]    Migraine     Past Surgical History: Past Surgical History:  Procedure Laterality Date   birthmark removed as a child     NO PAST SURGERIES      Obstetrical History: OB History     Gravida  3   Para  2   Term  2   Preterm  0   AB  0   Living  2      SAB  0   IAB  0   Ectopic  0   Multiple  0   Live Births  2            Social History Social History   Socioeconomic History   Marital status: Divorced    Spouse name: Not on file   Number of children: 2   Years of education: Not on file   Highest education level: Not on file  Occupational History   Not on file  Tobacco Use   Smoking status: Never   Smokeless tobacco: Never  Vaping Use   Vaping Use: Never used  Substance and Sexual Activity   Alcohol use: Not Currently    Comment: rarely   Drug use: No   Sexual activity: Yes    Birth control/protection: None  Other Topics Concern   Not on file  Social History Narrative   Not on file   Social Determinants of Health   Financial Resource Strain: Low Risk    Difficulty of Paying Living Expenses: Not very hard  Food Insecurity: No Food Insecurity   Worried About Running Out of Food in the Last Year: Never true  Ran Out of Food in the Last Year: Never true  Transportation Needs: No Transportation Needs   Lack of Transportation (Medical): No   Lack of Transportation (Non-Medical): No  Physical Activity: Sufficiently Active   Days of Exercise per Week: 7 days   Minutes of Exercise per Session: 60 min  Stress: No Stress Concern Present   Feeling of Stress : Not at all  Social Connections: Moderately Integrated   Frequency of Communication with Friends and Family: More than three times a week   Frequency of Social Gatherings with Friends and Family: Twice a week   Attends Religious Services: 1 to 4 times per year   Active Member of [employer] or Organizations: No   Attends Music therapist: Never   Marital Status: Living with partner    Family History: Family History  Problem Relation Age of Onset   Heart attack Paternal Grandfather    Stroke Maternal Grandmother    Suicidality Maternal Grandfather    Diabetes Father     Allergies: No Known Allergies  Medications Prior to Admission  Medication Sig Dispense Refill Last Dose   Blood Glucose Monitoring Suppl  (ACCU-CHEK GUIDE ME) w/Device KIT 1 each by Does not apply route 4 (four) times daily. 1 kit 0 Past Week   glucose blood test strip Use as instructed 100 each PRN Past Week   Lancets (ONETOUCH ULTRASOFT) lancets Use as directed to check blood sugar 4 times daily 100 each PRN Past Week   pantoprazole (PROTONIX) 20 MG tablet Take 1 tablet (20 mg total) by mouth daily. 30 tablet 1 01/12/2021   prenatal vitamin w/FE, FA (PRENATAL 1 + 1) 27-1 MG TABS tablet Take 1 tablet by mouth daily at 12 noon. 30 tablet 12 01/12/2021   aspirin 81 MG EC tablet Take 1 tablet (81 mg total) by mouth daily. Swallow whole. (Patient not taking: No sig reported) 90 tablet 3 More than a month     Review of Systems   All systems reviewed and negative except as stated in HPI  Height _0  (1.651 m), weight 79.5 kg, last menstrual period 04/08/2020. General appearance: alert, cooperative, and no distress Lungs: clear to auscultation bilaterally Heart: regular rate and rhythm Abdomen: soft, non-tender; bowel sounds normal Pelvic: n/a Extremities: Homans sign is negative, no sign of DVT DTR's +2 Presentation: cephalic Fetal monitoringBaseline: 150 bpm, Variability: Good {> 6 bpm), Accelerations: Reactive, and Decelerations: Absent Uterine activityNone Dilation: 1.5 Effacement (%): 50 Station: -3 Exam by:: Sharolyn Douglas, CNM   Prenatal labs: ABO, Rh: --/--/PENDING (11/12 0045) Antibody: PENDING (11/12 0045) Rubella: <0.90 (05/11 1055) RPR: Non Reactive (08/23 0813)  HBsAg: Negative (05/11 1055)  HIV: Non Reactive (08/23 0813)  GBS: Positive/-- (10/25 0000)   Prenatal Transfer Tool  Maternal Diabetes: Yes:  Diabetes Type:  Diet controlled Genetic Screening: Normal Maternal Ultrasounds/Referrals: Normal Fetal Ultrasounds or other Referrals:  None Maternal Substance Abuse:  No Significant Maternal Medications:  None Significant Maternal Lab Results: Group B Strep positive  Results for orders placed or  performed during the hospital encounter of 01/13/21 (from the past 24 hour(s))  Type and screen   Collection Time: 01/13/21 12:45 AM  Result Value Ref Range   ABO/RH(D) PENDING    Antibody Screen PENDING    Sample Expiration      01/16/2021,2359 Performed at [facility], [address], [place], [place] [address]     Patient Active Problem List   Diagnosis Date Noted   Positive  testing for group B Streptococcus 01/10/2021   Gestational diabetes mellitus, class A1 10/25/2020   Rubella non-immune status, antepartum 07/13/2020   Supervision of high risk pregnancy, antepartum 07/12/2020    Assessment/Plan:  Vickie Lloyd is a 30 y.o. G3P2002 at 45w0dhere for IOL for A1GDM  #Labor: Discussed with patient placement of foley balloon for cervical ripening. Risks and benefits reviewed. Patient agreeable to plan of care. Foley balloon inserted without difficulty and inflated with 60cc sterile water. Patient tolerated procedure well.  Will give cytotec q4 until FB out #Pain: Per patient request #FWB: Cat 1 #ID:  GBS pos #MOF: Breast #MOC: considering IUD #Circ:  NSan Isidro CNM  01/13/2021, 1:14 AM

## 2021-01-13 NOTE — Progress Notes (Signed)
Labor Progress Note Vickie Lloyd is a 30 y.o. G3P2002 at [redacted]w[redacted]d presented for IOL for A1GDM  S:  Patient uncomfortable and breathing through contractions. FB out.   O:  BP 109/71   Pulse 78   Temp 98.1 F (36.7 C) (Oral)   Resp 15   Ht 5\' 5"  (1.651 m)   Wt 79.5 kg   LMP 04/08/2020   BMI 29.15 kg/m   Fetal Tracing:  Baseline: 130 Variability: moderate Accels: 15x15 Decels: none  Toco: 3-5   CVE: deferred  A&P: 30 y.o. 06/06/2020 [redacted]w[redacted]d IOL A1GDM #Labor: Progressing well. Discussed next steps of IOL and patient desires epidural before exam and pitocin #Pain: plans to get epidural now #FWB: Cat 1 #GBS positive PCN  [redacted]w[redacted]d, CNM 5:04 AM

## 2021-01-13 NOTE — Discharge Summary (Signed)
   Postpartum Discharge Summary     Patient Name: Vickie Lloyd DOB: 03/30/1990 MRN: [id number]  Date of admission: 01/13/2021 Delivery date:01/13/2021  Delivering provider: DAHBURA, ANTON  Date of discharge: 01/14/2021  Admitting diagnosis: Gestational diabetes mellitus, class A1 [O24.410] Intrauterine pregnancy: [redacted]w[redacted]d     Secondary diagnosis:  Principal Problem:   Vaginal delivery Active Problems:   Rubella non-immune status, antepartum   Gestational diabetes mellitus, class A1   Positive testing for group B Streptococcus  Additional problems: None     Discharge diagnosis: Term Pregnancy Delivered and GDM A1                                              Post partum procedures: N/A Augmentation: AROM, Cytotec, and IP Foley Complications: None  Hospital course: Induction of Labor With Vaginal Delivery   30 y.o. yo G3P2002 at [redacted]w[redacted]d was admitted to the hospital 01/13/2021 for induction of labor.  Indication for induction: A1 DM.  Patient had an uncomplicated labor course as follows: Membrane Rupture Time/Date: 6:25 AM ,01/13/2021   Delivery Method: Vaginal, Spontaneous  Episiotomy: None  Lacerations: None  Details of delivery can be found in separate delivery note.  Patient had a routine postpartum course and is meeting all milestones. Her fasting glucose was 98 on PPD#1. Patient is discharged home 01/14/21.  Newborn Data: Birth date:01/13/2021  Birth time:10:05 AM  Gender:Female  Living status:Living  Apgars:9 ,9  Weight:3170 g   Magnesium Sulfate received: No BMZ received: No Rhophylac: N/A MMR: Offered postpartum T-DaP: Given prenatally 11/21/2020 Flu: No Transfusion: No  Physical exam  Vitals:   01/13/21 1714 01/13/21 2022 01/13/21 2335 01/14/21 0551  BP: 123/70 114/81 100/69 105/77  Pulse: 89 87 80 60  Resp: 18 16 18 18  Temp: 98 F (36.7 C) 98.1 F (36.7 C) 98.1 F (36.7 C) 97.9 F (36.6 C)  TempSrc: Oral Oral Oral Oral  SpO2: 98% 100% 99% 100%   Weight:      Height:       General: alert, cooperative, and no distress Lochia: appropriate Uterine Fundus: firm and below umbilicus  DVT Evaluation: no LE edema or calf tenderness to palpation   Labs: Lab Results  Component Value Date   WBC 11.4 (H) 01/13/2021   HGB 10.1 (L) 01/13/2021   HCT 31.0 (L) 01/13/2021   MCV 79.5 (L) 01/13/2021   PLT 274 01/13/2021   CMP Latest Ref Rng & Units 10/26/2020  Glucose 70 - 99 mg/dL 86  BUN 6 - 20 mg/dL 9  Creatinine 0.44 - 1.00 mg/dL 0.63  Sodium 135 - 145 mmol/L 135  Potassium 3.5 - 5.1 mmol/L 3.9  Chloride 98 - 111 mmol/L 106  CO2 22 - 32 mmol/L 22  Calcium 8.9 - 10.3 mg/dL 8.3(L)  Total Protein 6.5 - 8.1 g/dL 5.9(L)  Total Bilirubin 0.3 - 1.2 mg/dL 0.7  Alkaline Phos 38 - 126 U/L 64  AST 15 - 41 U/L 17  ALT 0 - 44 U/L 18   Edinburgh Score: No flowsheet data found.   After visit meds:  Allergies as of 01/14/2021   No Known Allergies      Medication List     STOP taking these medications    Accu-Chek Guide Me w/Device Kit   aspirin 81 MG EC tablet   glucose blood test strip   onetouch   ultrasoft lancets   pantoprazole 20 MG tablet Commonly known as: Protonix       TAKE these medications    acetaminophen 500 MG tablet Commonly known as: TYLENOL Take 2 tablets (1,000 mg total) by mouth every 8 (eight) hours as needed (pain).   ibuprofen 600 MG tablet Commonly known as: ADVIL Take 1 tablet (600 mg total) by mouth every 6 (six) hours as needed (pain).   prenatal vitamin w/FE, FA 27-1 MG Tabs tablet Take 1 tablet by mouth daily at 12 noon.         Discharge home in stable condition Infant Feeding: Breast Infant Disposition: home with mother Discharge instruction: per After Visit Summary and Postpartum booklet. Activity: Advance as tolerated. Pelvic rest for 6 weeks.  Diet: routine diet Future Appointments: Follow up Visit: Message sent by Sam Weinhold, CNM 01/13/2021  Please schedule this  patient for a In person postpartum visit in  4-6 weeks  with the following provider: Any provider. Additional Postpartum F/U: 2 hour GTT  High risk pregnancy complicated by: GDM Delivery mode:  Vaginal, Spontaneous  Anticipated Birth Control:  IUD outpatient  01/14/2021 Christina M Albert, MD    

## 2021-01-13 NOTE — Anesthesia Procedure Notes (Signed)
Epidural Patient location during procedure: OB Start time: 01/13/2021 5:36 AM End time: 01/13/2021 5:39 AM  Staffing Anesthesiologist: Kaylyn Layer, MD Performed: anesthesiologist   Preanesthetic Checklist Completed: patient identified, IV checked, risks and benefits discussed, monitors and equipment checked, pre-op evaluation and timeout performed  Epidural Patient position: sitting Prep: DuraPrep and site prepped and draped Patient monitoring: continuous pulse ox, blood pressure and heart rate Approach: midline Location: L3-L4 Injection technique: LOR air  Needle:  Needle type: Tuohy  Needle gauge: 17 G Needle length: 9 cm Needle insertion depth: 5 cm Catheter type: closed end flexible Catheter size: 19 Gauge Catheter at skin depth: 10 cm Test dose: negative and Other (1% lidocaine)  Assessment Events: blood not aspirated, injection not painful, no injection resistance, no paresthesia and negative IV test  Additional Notes Patient identified. Risks, benefits, and alternatives discussed with patient including but not limited to bleeding, infection, nerve damage, paralysis, failed block, incomplete pain control, headache, blood pressure changes, nausea, vomiting, reactions to medication, itching, and postpartum back pain. Confirmed with bedside nurse the patient's most recent platelet count. Confirmed with patient that they are not currently taking any anticoagulation, have any bleeding history, or any family history of bleeding disorders. Patient expressed understanding and wished to proceed. All questions were answered. Sterile technique was used throughout the entire procedure. Please see nursing notes for vital signs.   Crisp LOR on first pass. Test dose was given through epidural catheter and negative prior to continuing to dose epidural or start infusion. Warning signs of high block given to the patient including shortness of breath, tingling/numbness in hands, complete  motor block, or any concerning symptoms with instructions to call for help. Patient was given instructions on fall risk and not to get out of bed. All questions and concerns addressed with instructions to call with any issues or inadequate analgesia.  Reason for block:procedure for pain

## 2021-01-14 LAB — GLUCOSE, CAPILLARY: Glucose-Capillary: 98 mg/dL (ref 70–99)

## 2021-01-14 MED ORDER — IBUPROFEN 600 MG PO TABS: 600.0000 mg | ORAL_TABLET | Freq: Four times a day (QID) | ORAL | 0 refills | Status: DC | PRN

## 2021-01-14 MED ORDER — MEASLES, MUMPS & RUBELLA VAC IJ SOLR
0.5000 mL | Freq: Once | INTRAMUSCULAR | Status: AC
  Administered 2021-01-14: 0.5 mL via SUBCUTANEOUS
  Filled 2021-01-14: qty 0.5

## 2021-01-14 MED ORDER — ACETAMINOPHEN 500 MG PO TABS: 1000.0000 mg | ORAL_TABLET | Freq: Three times a day (TID) | ORAL | 0 refills | Status: DC | PRN

## 2021-01-14 NOTE — Lactation Note (Signed)
This note was copied from a baby's chart. Lactation Consultation Note  Patient Name: Vickie Lloyd [id number] Date: 01/14/2021 Reason for consult: Follow-up assessment;1st time breastfeeding;Term Age:30 hours   P3 mother whose infant is now 22 hours old.  This is a term baby at 40+0 weeks.  This is mother's first time breast feeding.  RN requested latch assistance.  Mother has had some difficulty with latching baby and sustaining her latch.  Baby "[name]" was showing cues when I arrived.  Mother has been using a #20 NS.  Reviewed hand expression and encouraged mother to feed back any EBM she obtains to baby.  Mother placed NS and I assisted to latch in the cross cradle hold.  Baby began actively sucking, however, she gets frustrated easily.  Repeated attempts needed to stay latched and actively sucking.  Burped well and placed back at the breast.  With continued encouragement she was able to feed for 20 minutes.  Colostrum noted in NS at the end of the feeding.  Initiated the DEBP; reasons why this is beneficial explained to mother.  #24 flange size is appropriate at this time.  Pump parts, assembly and cleaning reviewed.  Wash station set up.  Encouraged to call for latch assistance as needed.  Mother complaining of cramping with feeding; RN provided Tylenol.    Mother has a DEBP for home use.  Father present and asleep.   Maternal Data Has patient been taught Hand Expression?: Yes Does the patient have breastfeeding experience prior to this delivery?: No  Feeding Mother's Current Feeding Choice: Breast Milk  LATCH Score Latch: Repeated attempts needed to sustain latch, nipple held in mouth throughout feeding, stimulation needed to elicit sucking reflex.  Audible Swallowing: A few with stimulation  Type of Nipple: Everted at rest and after stimulation (short shafted)  Comfort (Breast/Nipple): Soft / non-tender  Hold (Positioning): Assistance needed to correctly position infant  at breast and maintain latch.  LATCH Score: 7   Lactation Tools Discussed/Used Tools: Pump;Flanges;Coconut oil Nipple shield size: 20 Flange Size: 24 Breast pump type: Double-Electric Breast Pump;Manual Pump Education: Setup, frequency, and cleaning Reason for Pumping: Using a NS; breast stimulation Pumping frequency: Every three hours  Interventions Interventions: Breast feeding basics reviewed;Assisted with latch;Skin to skin;Breast massage;Hand express;Breast compression;Hand pump;Coconut oil;Position options;Support pillows;Adjust position;DEBP;Education  Discharge Pump: Manual;Personal;DEBP WIC Program: No  Consult Status Consult Status: Follow-up Date: 01/15/21 Follow-up type: In-patient    [name] 01/14/2021, 6:43 AM

## 2021-01-14 NOTE — Anesthesia Postprocedure Evaluation (Signed)
Anesthesia Post Note  Patient: [name]  Procedure(s) Performed: AN AD HOC LABOR EPIDURAL     Patient location during evaluation: Mother Baby Anesthesia Type: Epidural Level of consciousness: awake, oriented and awake and alert Pain management: pain level controlled Vital Signs Assessment: post-procedure vital signs reviewed and stable Respiratory status: respiratory function stable, spontaneous breathing and nonlabored ventilation Cardiovascular status: stable Postop Assessment: no headache, adequate PO intake, able to ambulate, patient able to bend at knees, no apparent nausea or vomiting and no backache Anesthetic complications: no   No notable events documented.  Last Vitals:  Vitals:   01/13/21 2335 01/14/21 0551  BP: 100/69 105/77  Pulse: 80 60  Resp: 18 18  Temp: 36.7 C 36.6 C  SpO2: 99% 100%    Last Pain:  Vitals:   01/14/21 0805  TempSrc:   PainSc: 3    Pain Goal: Patients Stated Pain Goal: 3 (01/13/21 2335)                 Judianne Seiple

## 2021-01-15 ENCOUNTER — Telehealth: Payer: Self-pay

## 2021-01-15 NOTE — Telephone Encounter (Signed)
Transition Care Management Unsuccessful Follow-up Telephone Call  Date of discharge and from where:  01/14/2021 from [facility]  Attempts:  1st Attempt  Reason for unsuccessful TCM follow-up call:  Left voice message

## 2021-01-16 NOTE — Telephone Encounter (Signed)
Transition Care Management Follow-up Telephone Call Date of discharge and from where: 01/13/2021 from [facility] How have you been since you were released from the hospital? Pt stated that she doing well and did not have any questions or concerns at this time.  Any questions or concerns? No  Items Reviewed: Did the pt receive and understand the discharge instructions provided? Yes  Medications obtained and verified? Yes  Other? No  Any new allergies since your discharge? No  Dietary orders reviewed? No Do you have support at home? Yes   Functional Questionnaire: (I = Independent and D = Dependent) ADLs: I  Bathing/Dressing- I  Meal Prep- I  Eating- I  Maintaining continence- I  Transferring/Ambulation- I  Managing Meds- I  Follow up appointments reviewed: PCP Hospital f/u appt confirmed? No   Specialist Hospital f/u appt confirmed? Yes  Scheduled to see Shawna Clamp, CNM on 02/21/2021 @ 11:50am. Are transportation arrangements needed? No  If their condition worsens, is the pt aware to call PCP or go to the Emergency Dept.? Yes Was the patient provided with contact information for the PCP's office or ED? Yes Was to pt encouraged to call back with questions or concerns? Yes

## 2021-01-24 ENCOUNTER — Telehealth (HOSPITAL_COMMUNITY): Payer: Self-pay | Admitting: *Deleted

## 2021-01-24 NOTE — Telephone Encounter (Signed)
Mom reports feeling good. No concerns about herself at this time. EPDS=1 [facility] score=1 ) Mom reports baby is doing well. Feeding, peeing, and pooping without difficulty. Safe sleep reviewed. Mom reports no concerns about baby at present.  Duffy Rhody, RN 01-24-2021 at 11:32am

## 2021-02-21 ENCOUNTER — Ambulatory Visit: Payer: Medicaid Other | Admitting: Advanced Practice Midwife

## 2021-02-21 ENCOUNTER — Encounter: Payer: Self-pay | Admitting: Women's Health

## 2021-02-21 DIAGNOSIS — Z30011 Encounter for initial prescription of contraceptive pills: Secondary | ICD-10-CM

## 2021-02-21 MED ORDER — NORETHINDRONE 0.35 MG PO TABS: 1.0000 | ORAL_TABLET | Freq: Every day | ORAL | 3 refills | Status: DC

## 2021-03-02 ENCOUNTER — Other Ambulatory Visit: Payer: Self-pay

## 2021-03-02 ENCOUNTER — Encounter: Payer: Self-pay | Admitting: Medical

## 2021-03-02 ENCOUNTER — Ambulatory Visit (INDEPENDENT_AMBULATORY_CARE_PROVIDER_SITE_OTHER): Payer: Medicaid Other | Admitting: Medical

## 2021-03-02 VITALS — BP 97/68 | HR 81 | Ht 65.0 in | Wt 164.0 lb

## 2021-03-02 DIAGNOSIS — Z8632 Personal history of gestational diabetes: Secondary | ICD-10-CM | POA: Diagnosis not present

## 2021-03-02 DIAGNOSIS — Z30430 Encounter for insertion of intrauterine contraceptive device: Secondary | ICD-10-CM

## 2021-03-02 DIAGNOSIS — Z3202 Encounter for pregnancy test, result negative: Secondary | ICD-10-CM

## 2021-03-02 DIAGNOSIS — K5901 Slow transit constipation: Secondary | ICD-10-CM

## 2021-03-02 DIAGNOSIS — R4589 Other symptoms and signs involving emotional state: Secondary | ICD-10-CM

## 2021-03-02 LAB — POCT URINE PREGNANCY: Preg Test, Ur: NEGATIVE

## 2021-03-02 MED ORDER — LEVONORGESTREL 20 MCG/DAY IU IUD
1.0000 | INTRAUTERINE_SYSTEM | Freq: Once | INTRAUTERINE | Status: AC
  Administered 2021-03-02: 12:00:00 1 via INTRAUTERINE

## 2021-03-02 MED ORDER — DULCOLAX 5 MG PO TBEC: 5.0000 mg | DELAYED_RELEASE_TABLET | Freq: Every day | ORAL | 0 refills | Status: DC | PRN

## 2021-03-02 NOTE — Progress Notes (Signed)
Post Partum Visit Note  Vickie Lloyd is a 30 y.o. G38P3003 female who presents for a postpartum visit. She is 6 weeks postpartum following a normal spontaneous vaginal delivery.  I have fully reviewed the prenatal and intrapartum course. The delivery was at 40.0 gestational weeks.  Anesthesia: epidural. Postpartum course has been complicated by mood. Baby is doing well. Baby is feeding by both breast and bottle - Similac Sensitive RS. Bleeding no bleeding. Bowel function is abnormal: constipation . Bladder function is normal. Patient is sexually active. Contraception method is oral progesterone-only contraceptive. Postpartum depression screening: negative.   Upstream - 03/02/21 1051       Pregnancy Intention Screening   Does the patient want to become pregnant in the next year? Unsure    Does the patient's partner want to become pregnant in the next year? Unsure    Would the patient like to discuss contraceptive options today? Yes      Contraception Wrap Up   Current Method Female Condom            The pregnancy intention screening data noted above was reviewed. Potential methods of contraception were discussed. The patient elected to proceed with No data recorded.   Edinburgh Postnatal Depression Scale - 03/02/21 1051       Edinburgh Postnatal Depression Scale:  In the Past 7 Days   I have been able to laugh and see the funny side of things. 1    I have looked forward with enjoyment to things. 1    I have blamed myself unnecessarily when things went wrong. 1    I have been anxious or worried for no good reason. 0    I have felt scared or panicky for no good reason. 0    Things have been getting on top of me. 2    I have been so unhappy that I have had difficulty sleeping. 0    I have felt sad or miserable. 2    I have been so unhappy that I have been crying. 1    The thought of harming myself has occurred to me. 0    Edinburgh Postnatal Depression Scale Total 8              Health Maintenance Due  Topic Date Due   Pneumococcal Vaccine 54-65 Years old (1 - PCV) Never done   URINE MICROALBUMIN  Never done   INFLUENZA VACCINE  10/02/2020    The following portions of the patient's history were reviewed and updated as appropriate: allergies, current medications, past family history, past medical history, past social history, past surgical history, and problem list.  Review of Systems Pertinent items are noted in HPI.  Objective:  BP 97/68 (BP Location: Left Arm, Patient Position: Sitting, Cuff Size: Normal)    Pulse 81    Ht 5\' 5"  (1.651 m)    Wt 164 lb (74.4 kg)    Breastfeeding Yes    BMI 27.29 kg/m    General:  alert and cooperative   Breasts:  not indicated  Lungs: clear to auscultation bilaterally  Heart:  regular rate and rhythm, S1, S2 normal, no murmur, click, rub or gallop  Abdomen: Soft, non-tender    Wound N/A  GU exam:  normal        GYNECOLOGY CLINIC PROCEDURE NOTE  IUD Insertion Procedure Note Patient identified, informed consent performed.  Discussed risks of irregular bleeding, cramping, infection, malpositioning or misplacement of the IUD outside the  uterus which may require further procedure such as laparoscopy. Time out was performed.  Urine pregnancy test negative.  Speculum placed in the vagina.  Cervix visualized.  Cleaned with Betadine x 2.  Grasped anteriorly with a single tooth tenaculum.  Uterus sounded to 7 cm.  Mirena IUD placed per manufacturer's recommendations.  Strings trimmed to 3 cm. Tenaculum was removed, good hemostasis noted.  Patient tolerated procedure well.   Patient was given post-procedure instructions.  She was advised to be have backup contraception for one week.  Patient was also asked to check IUD strings periodically and follow up in 4 weeks for IUD check.   Assessment:    1. Pregnancy examination or test, negative result  2. Routine postpartum follow-up  3. History of gestational diabetes - Will  schedule fasting 2 hour  Plan:   Essential components of care per ACOG recommendations:  1.  Mood and well being: Patient with negative depression screening today. Reviewed local resources for support.  - Patient tobacco use? No.   - hx of drug use? No.    2. Infant care and feeding:  -Patient currently breastmilk feeding? Yes. Discussed returning to work and pumping. Reviewed importance of draining breast regularly to support lactation.  -Social determinants of health (SDOH) reviewed in EPIC. No concerns.   3. Sexuality, contraception and birth spacing - Patient does not want a pregnancy in the next year.  Desired family size is 3 children.  - Reviewed forms of contraception in tiered fashion. Patient desired IUD today.   - Discussed birth spacing of 18 months  4. Sleep and fatigue -Encouraged family/partner/community support of 4 hrs of uninterrupted sleep to help with mood and fatigue  5. Physical Recovery  - Discussed patients delivery and complications. She describes her labor as good. - Patient had a Vaginal, no problems at delivery. Patient had no laceration. Perineal healing reviewed. Patient expressed understanding - Patient has urinary incontinence? No. - Patient is safe to resume physical and sexual activity  6.  Health Maintenance - HM due items addressed Yes - Last pap smear  Diagnosis  Date Value Ref Range Status  08/03/2020   Final   - Negative for intraepithelial lesion or malignancy (NILM)   Pap smear not done at today's visit.  -Breast Cancer screening indicated? No.   7. Chronic Disease/Pregnancy Condition follow up: None - Referred to PCP to establish care   8. [facility] - Referrals sent  9. Constipation - Rx sent  Vonzella Nipple, PA-C Center for [employer], [facility]

## 2021-03-06 ENCOUNTER — Telehealth: Payer: Self-pay | Admitting: Clinical

## 2021-03-06 NOTE — Telephone Encounter (Signed)
Attempt to schedule, per referral; Left HIPPA-compliant message to call back Han Vejar from [facility] at [facility] at  [phone] (Zoella Roberti's office); left MyChart message for pt. °

## 2021-03-07 ENCOUNTER — Other Ambulatory Visit: Payer: Medicaid Other

## 2021-03-07 DIAGNOSIS — O24410 Gestational diabetes mellitus in pregnancy, diet controlled: Secondary | ICD-10-CM

## 2021-03-30 ENCOUNTER — Encounter: Payer: Self-pay | Admitting: Obstetrics & Gynecology

## 2021-03-30 ENCOUNTER — Ambulatory Visit (INDEPENDENT_AMBULATORY_CARE_PROVIDER_SITE_OTHER): Payer: Medicaid Other | Admitting: Obstetrics & Gynecology

## 2021-03-30 ENCOUNTER — Other Ambulatory Visit: Payer: Self-pay

## 2021-03-30 VITALS — BP 109/72 | HR 80 | Ht 65.0 in | Wt 163.0 lb

## 2021-03-30 DIAGNOSIS — N939 Abnormal uterine and vaginal bleeding, unspecified: Secondary | ICD-10-CM

## 2021-03-30 DIAGNOSIS — Z30431 Encounter for routine checking of intrauterine contraceptive device: Secondary | ICD-10-CM | POA: Diagnosis not present

## 2021-03-30 NOTE — Progress Notes (Signed)
° °  GYN VISIT Patient name: Vickie Lloyd MRN [id number]  Date of birth: [birth year] Chief Complaint:   check IUD strings (+ cramping and bleeding)  History of Present Illness:   Vickie Lloyd is a 31 y.o. [phone]  female being seen today for   IUD follow up: Mirena placed at [facility] on 03/02/21.  Today she notes that for the first two weeks after she had moderate to heavy bleeding with some significant cramping.  It does seem to have improved, but she is still having daily bleeding, using several pads/tampons per day.     No LMP recorded. (Menstrual status: IUD).  Depression screen [facility] 2/9 07/12/2020 09/30/2017 08/06/2017 11/18/2016 01/19/2016  Decreased Interest 0 0 0 0 0  Down, Depressed, Hopeless 0 0 0 0 0  PHQ - 2 Score 0 0 0 0 0  Altered sleeping 0 - - - -  Tired, decreased energy 0 - - - -  Change in appetite 0 - - - -  Feeling bad or failure about yourself  0 - - - -  Trouble concentrating 0 - - - -  Moving slowly or fidgety/restless 0 - - - -  Suicidal thoughts 0 - - - -  PHQ-9 Score 0 - - - -     Review of Systems:   Pertinent items are noted in HPI Denies fever/chills, dizziness, headaches, visual disturbances, fatigue, shortness of breath, chest pain, abdominal pain, vomiting, bowel movements, urination, or intercourse unless otherwise stated above.  Pertinent History Reviewed:  Reviewed past medical,surgical, social, obstetrical and family history.  Reviewed problem list, medications and allergies. Physical Assessment:   Vitals:   03/30/21 1130  BP: 109/72  Pulse: 80  Weight: 163 lb (73.9 kg)  Height: 5\' 5"  (1.651 m)  Body mass index is 27.12 kg/m.       Physical Examination:   General appearance: alert, well appearing, and in no distress  Psych: mood appropriate, normal affect  Skin: warm & dry   Cardiovascular: normal heart rate noted  Respiratory: normal respiratory effort, no distress  Abdomen: soft, non-tender   Pelvic: normal external genitalia, vulva,  vagina, cervix- strings visualized at os Extremities: no edema   Chaperone:  pt declined     IUD appears in proper location based on bedside  Assessment & Plan:  1) Mirena check -strings visualized -suspect irregular bleeding should improve over the next few weeks- should she note continued bleeding/cramping, RTC to discuss options   Return in about 1 year (around 03/30/2022) for Annual.   04/01/2022, DO Attending Obstetrician & Gynecologist, Faculty Practice [facility] for [facility], [facility]

## 2021-08-01 ENCOUNTER — Telehealth: Payer: Self-pay | Admitting: Clinical

## 2021-08-01 NOTE — Telephone Encounter (Signed)
Attempt to follow up regarding referral; Left HIPPA-compliant message to call back Asher Muir from [employer] for [employer] at [facility] at  [phone] [facility] office).

## 2021-12-22 DIAGNOSIS — R070 Pain in throat: Secondary | ICD-10-CM | POA: Diagnosis not present

## 2021-12-22 DIAGNOSIS — R059 Cough, unspecified: Secondary | ICD-10-CM | POA: Diagnosis not present

## 2021-12-27 DIAGNOSIS — J329 Chronic sinusitis, unspecified: Secondary | ICD-10-CM | POA: Diagnosis not present

## 2021-12-27 DIAGNOSIS — R051 Acute cough: Secondary | ICD-10-CM | POA: Diagnosis not present

## 2021-12-27 DIAGNOSIS — Z6824 Body mass index (BMI) 24.0-24.9, adult: Secondary | ICD-10-CM | POA: Diagnosis not present

## 2021-12-27 DIAGNOSIS — J029 Acute pharyngitis, unspecified: Secondary | ICD-10-CM | POA: Diagnosis not present

## 2022-01-28 DIAGNOSIS — R059 Cough, unspecified: Secondary | ICD-10-CM | POA: Diagnosis not present

## 2022-01-28 DIAGNOSIS — J329 Chronic sinusitis, unspecified: Secondary | ICD-10-CM | POA: Diagnosis not present

## 2022-01-28 DIAGNOSIS — B9689 Other specified bacterial agents as the cause of diseases classified elsewhere: Secondary | ICD-10-CM | POA: Diagnosis not present

## 2022-02-05 ENCOUNTER — Ambulatory Visit: Payer: BLUE CROSS/BLUE SHIELD | Admitting: Nurse Practitioner

## 2022-02-06 ENCOUNTER — Encounter: Payer: Self-pay | Admitting: Obstetrics and Gynecology

## 2022-03-29 DIAGNOSIS — R1030 Lower abdominal pain, unspecified: Secondary | ICD-10-CM | POA: Diagnosis not present

## 2022-03-29 DIAGNOSIS — R350 Frequency of micturition: Secondary | ICD-10-CM | POA: Diagnosis not present

## 2022-03-29 DIAGNOSIS — N200 Calculus of kidney: Secondary | ICD-10-CM | POA: Diagnosis not present

## 2022-04-13 DIAGNOSIS — Z0000 Encounter for general adult medical examination without abnormal findings: Secondary | ICD-10-CM | POA: Diagnosis not present

## 2022-05-28 DIAGNOSIS — R110 Nausea: Secondary | ICD-10-CM | POA: Diagnosis not present

## 2022-05-28 DIAGNOSIS — K529 Noninfective gastroenteritis and colitis, unspecified: Secondary | ICD-10-CM | POA: Diagnosis not present

## 2022-11-07 DIAGNOSIS — Z20822 Contact with and (suspected) exposure to covid-19: Secondary | ICD-10-CM | POA: Diagnosis not present

## 2022-11-07 DIAGNOSIS — J209 Acute bronchitis, unspecified: Secondary | ICD-10-CM | POA: Diagnosis not present

## 2022-11-07 DIAGNOSIS — R070 Pain in throat: Secondary | ICD-10-CM | POA: Diagnosis not present

## 2023-01-01 ENCOUNTER — Ambulatory Visit: Payer: Medicaid Other | Admitting: Family Medicine

## 2023-01-01 ENCOUNTER — Telehealth: Payer: Self-pay | Admitting: Family Medicine

## 2023-01-01 ENCOUNTER — Encounter: Payer: Self-pay | Admitting: Family Medicine

## 2023-01-01 VITALS — BP 115/84 | HR 80 | Temp 97.5°F | Ht 65.0 in | Wt 164.0 lb

## 2023-01-01 DIAGNOSIS — F419 Anxiety disorder, unspecified: Secondary | ICD-10-CM | POA: Diagnosis not present

## 2023-01-01 DIAGNOSIS — Z6827 Body mass index (BMI) 27.0-27.9, adult: Secondary | ICD-10-CM

## 2023-01-01 DIAGNOSIS — Z136 Encounter for screening for cardiovascular disorders: Secondary | ICD-10-CM

## 2023-01-01 DIAGNOSIS — R5383 Other fatigue: Secondary | ICD-10-CM

## 2023-01-01 DIAGNOSIS — F322 Major depressive disorder, single episode, severe without psychotic features: Secondary | ICD-10-CM

## 2023-01-01 MED ORDER — FLUOXETINE HCL 20 MG PO CAPS: 20.0000 mg | ORAL_CAPSULE | Freq: Every day | ORAL | 0 refills | Status: DC

## 2023-01-01 MED ORDER — HYDROXYZINE PAMOATE 25 MG PO CAPS: 25.0000 mg | ORAL_CAPSULE | Freq: Three times a day (TID) | ORAL | 0 refills | Status: DC | PRN

## 2023-01-01 NOTE — Patient Instructions (Signed)
[facility]  Phone:  [phone]  Address:  [address]  [place], [place] [address]  Hours:  Open 24/7, No appointment required.  
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
## 2023-01-01 NOTE — Telephone Encounter (Signed)
Future lab orders placed

## 2023-01-03 ENCOUNTER — Encounter: Payer: Self-pay | Admitting: *Deleted

## 2023-01-06 ENCOUNTER — Other Ambulatory Visit: Payer: Medicaid Other

## 2023-01-06 ENCOUNTER — Telehealth: Payer: Medicaid Other | Admitting: Family

## 2023-01-06 ENCOUNTER — Encounter: Payer: Self-pay | Admitting: Family

## 2023-01-06 DIAGNOSIS — R059 Cough, unspecified: Secondary | ICD-10-CM | POA: Diagnosis not present

## 2023-01-06 DIAGNOSIS — Z6827 Body mass index (BMI) 27.0-27.9, adult: Secondary | ICD-10-CM

## 2023-01-06 DIAGNOSIS — R509 Fever, unspecified: Secondary | ICD-10-CM | POA: Diagnosis not present

## 2023-01-06 DIAGNOSIS — Z136 Encounter for screening for cardiovascular disorders: Secondary | ICD-10-CM | POA: Diagnosis not present

## 2023-01-06 DIAGNOSIS — M791 Myalgia, unspecified site: Secondary | ICD-10-CM | POA: Diagnosis not present

## 2023-01-06 DIAGNOSIS — R5383 Other fatigue: Secondary | ICD-10-CM | POA: Diagnosis not present

## 2023-01-06 DIAGNOSIS — J029 Acute pharyngitis, unspecified: Secondary | ICD-10-CM | POA: Diagnosis not present

## 2023-01-06 DIAGNOSIS — R519 Headache, unspecified: Secondary | ICD-10-CM

## 2023-01-06 DIAGNOSIS — R6889 Other general symptoms and signs: Secondary | ICD-10-CM

## 2023-01-06 DIAGNOSIS — H9209 Otalgia, unspecified ear: Secondary | ICD-10-CM | POA: Diagnosis not present

## 2023-01-06 MED ORDER — FLUTICASONE PROPIONATE 50 MCG/ACT NA SUSP: 2.0000 | Freq: Every day | NASAL | 6 refills | Status: DC

## 2023-01-06 MED ORDER — BENZONATATE 200 MG PO CAPS: 200.0000 mg | ORAL_CAPSULE | Freq: Three times a day (TID) | ORAL | 1 refills | Status: DC | PRN

## 2023-01-06 MED ORDER — OSELTAMIVIR PHOSPHATE 75 MG PO CAPS: 75.0000 mg | ORAL_CAPSULE | Freq: Two times a day (BID) | ORAL | 0 refills | Status: DC

## 2023-01-06 NOTE — Patient Instructions (Signed)

## 2023-01-06 NOTE — Progress Notes (Signed)
Virtual Visit Consent   [name], you are scheduled for a virtual visit with a [facility] provider today. Just as with appointments in the office, your consent must be obtained to participate. Your consent will be active for this visit and any virtual visit you may have with one of our providers in the next 365 days. If you have a MyChart account, a copy of this consent can be sent to you electronically.  As this is a virtual visit, video technology does not allow for your provider to perform a traditional examination. This may limit your provider's ability to fully assess your condition. If your provider identifies any concerns that need to be evaluated in person or the need to arrange testing (such as labs, EKG, etc.), we will make arrangements to do so. Although advances in technology are sophisticated, we cannot ensure that it will always work on either your end or our end. If the connection with a video visit is poor, the visit may have to be switched to a telephone visit. With either a video or telephone visit, we are not always able to ensure that we have a secure connection.  By engaging in this virtual visit, you consent to the provision of healthcare and authorize for your insurance to be billed (if applicable) for the services provided during this visit. Depending on your insurance coverage, you may receive a charge related to this service.  I need to obtain your verbal consent now. Are you willing to proceed with your visit today? Vickie Lloyd has provided verbal consent on 01/06/2023 for a virtual visit (video or telephone). Vickie Rodney, FNP  Date: 01/06/2023 10:33 AM  Virtual Visit via Video Note   I, Vickie Lloyd, connected with  Vickie Lloyd  ([id number], December 29, 1990) on 01/06/23 at  5:15 PM EST by a video-enabled telemedicine application and verified that I am speaking with the correct person using two identifiers.  Location: Patient: Virtual Visit Location Patient:  Home Provider: Virtual Visit Location Provider: Office   I discussed the limitations of evaluation and management by telemedicine and the availability of in person appointments. The patient expressed understanding and agreed to proceed.    History of Present Illness: Vickie Lloyd is a 32 y.o. who identifies as a female who was assigned female at birth, and is being seen today for cough and fever that started yesterday. Did a home COVID test that was negative.   HPI: Cough This is a new problem. The current episode started yesterday. The problem has been unchanged. The problem occurs every few minutes. Associated symptoms include chills, ear congestion, ear pain, a fever, headaches, myalgias, nasal congestion, postnasal drip, a sore throat and shortness of breath. Pertinent negatives include no rhinorrhea or wheezing. She has tried rest for the symptoms. The treatment provided mild relief. There is no history of asthma or COPD.    Problems:  Patient Active Problem List   Diagnosis Date Noted   History of gestational diabetes 03/02/2021   Vaginal delivery 01/14/2021    Allergies: No Known Allergies Medications:  Current Outpatient Medications:    benzonatate (TESSALON) 200 MG capsule, Take 1 capsule (200 mg total) by mouth 3 (three) times daily as needed., Disp: 30 capsule, Rfl: 1   fluticasone (FLONASE) 50 MCG/ACT nasal spray, Place 2 sprays into both nostrils daily., Disp: 16 g, Rfl: 6   oseltamivir (TAMIFLU) 75 MG capsule, Take 1 capsule (75 mg total) by mouth 2 (two) times daily., Disp: 10  capsule, Rfl: 0   FLUoxetine (PROZAC) 20 MG capsule, Take 1 capsule (20 mg total) by mouth daily., Disp: 60 capsule, Rfl: 0   hydrOXYzine (VISTARIL) 25 MG capsule, Take 1 capsule (25 mg total) by mouth every 8 (eight) hours as needed., Disp: 60 capsule, Rfl: 0   levonorgestrel (MIRENA) 20 MCG/DAY IUD, 1 each by Intrauterine route once., Disp: , Rfl:   Observations/Objective: Patient is  well-developed, well-nourished in no acute distress.  Resting comfortably  at home.  Head is normocephalic, atraumatic.  No labored breathing.  Speech is clear and coherent with logical content.  Patient is alert and oriented at baseline.  Nasal congestion, cough  Assessment and Plan: 1. Flu-like symptoms - oseltamivir (TAMIFLU) 75 MG capsule; Take 1 capsule (75 mg total) by mouth 2 (two) times daily.  Dispense: 10 capsule; Refill: 0 - benzonatate (TESSALON) 200 MG capsule; Take 1 capsule (200 mg total) by mouth 3 (three) times daily as needed.  Dispense: 30 capsule; Refill: 1 - fluticasone (FLONASE) 50 MCG/ACT nasal spray; Place 2 sprays into both nostrils daily.  Dispense: 16 g; Refill: 6  - Take meds as prescribed - Use a cool mist humidifier  -Use saline nose sprays frequently -Force fluids -For any cough or congestion  Use plain Mucinex- regular strength or max strength is fine -For fever or aces or pains- take tylenol or ibuprofen. -Throat lozenges if help -Work note given -Follow up if symptoms worsen or do not improve   Follow Up Instructions: I discussed the assessment and treatment plan with the patient. The patient was provided an opportunity to ask questions and all were answered. The patient agreed with the plan and demonstrated an understanding of the instructions.  A copy of instructions were sent to the patient via MyChart unless otherwise noted below.     The patient was advised to call back or seek an in-person evaluation if the symptoms worsen or if the condition fails to improve as anticipated.    Vickie Rodney, FNP

## 2023-01-07 LAB — ANEMIA PROFILE B
Basophils Absolute: 0.1 10*3/uL (ref 0.0–0.2)
Basos: 1 %
EOS (ABSOLUTE): 0.1 10*3/uL (ref 0.0–0.4)
Eos: 1 %
Ferritin: 33 ng/mL (ref 15–150)
Folate: 5.3 ng/mL (ref >3.0)
Hematocrit: 44 % (ref 34.0–46.6)
Hemoglobin: 13.7 g/dL (ref 11.1–15.9)
Immature Grans (Abs): 0 10*3/uL (ref 0.0–0.1)
Immature Granulocytes: 0 %
Iron Saturation: 28 % (ref 15–55)
Iron: 100 ug/dL (ref 27–159)
Lymphocytes Absolute: 1.2 10*3/uL (ref 0.7–3.1)
Lymphs: 12 %
MCH: 27.6 pg (ref 26.6–33.0)
MCHC: 31.1 g/dL — ABNORMAL LOW (ref 31.5–35.7)
MCV: 89 fL (ref 79–97)
Monocytes Absolute: 0.6 10*3/uL (ref 0.1–0.9)
Monocytes: 6 %
Neutrophils Absolute: 8.5 10*3/uL — ABNORMAL HIGH (ref 1.4–7.0)
Neutrophils: 80 %
Platelets: 253 10*3/uL (ref 150–450)
RBC: 4.97 x10E6/uL (ref 3.77–5.28)
RDW: 12.7 % (ref 11.7–15.4)
Retic Ct Pct: 1.7 % (ref 0.6–2.6)
Total Iron Binding Capacity: 363 ug/dL (ref 250–450)
UIBC: 263 ug/dL (ref 131–425)
Vitamin B-12: 1193 pg/mL (ref 232–1245)
WBC: 10.5 10*3/uL (ref 3.4–10.8)

## 2023-01-07 LAB — LIPID PANEL
Chol/HDL Ratio: 3.8 ratio (ref 0.0–4.4)
Cholesterol, Total: 163 mg/dL (ref 100–199)
HDL: 43 mg/dL (ref >39)
LDL Chol Calc (NIH): 101 mg/dL — ABNORMAL HIGH (ref 0–99)
Triglycerides: 106 mg/dL (ref 0–149)
VLDL Cholesterol Cal: 19 mg/dL (ref 5–40)

## 2023-01-07 LAB — CMP14+EGFR
ALT: 15 [IU]/L (ref 0–32)
AST: 22 [IU]/L (ref 0–40)
Albumin: 4.2 g/dL (ref 3.9–4.9)
Alkaline Phosphatase: 69 [IU]/L (ref 44–121)
BUN/Creatinine Ratio: 11 (ref 9–23)
BUN: 8 mg/dL (ref 6–20)
Bilirubin Total: 0.8 mg/dL (ref 0.0–1.2)
CO2: 23 mmol/L (ref 20–29)
Calcium: 9.1 mg/dL (ref 8.7–10.2)
Chloride: 103 mmol/L (ref 96–106)
Creatinine, Ser: 0.75 mg/dL (ref 0.57–1.00)
Globulin, Total: 2.7 g/dL (ref 1.5–4.5)
Glucose: 90 mg/dL (ref 70–99)
Potassium: 4 mmol/L (ref 3.5–5.2)
Sodium: 137 mmol/L (ref 134–144)
Total Protein: 6.9 g/dL (ref 6.0–8.5)
eGFR: 108 mL/min/{1.73_m2} (ref >59)

## 2023-01-07 LAB — THYROID PANEL WITH TSH
Free Thyroxine Index: 1.9 (ref 1.2–4.9)
T3 Uptake Ratio: 25 % (ref 24–39)
T4, Total: 7.4 ug/dL (ref 4.5–12.0)
TSH: 1.19 u[IU]/mL (ref 0.450–4.500)

## 2023-01-07 LAB — VITAMIN D 25 HYDROXY (VIT D DEFICIENCY, FRACTURES): Vit D, 25-Hydroxy: 56.1 ng/mL (ref 30.0–100.0)

## 2023-01-07 NOTE — Progress Notes (Signed)
Slightly elevated LDL. Recommend Avoid fried, greasy, and fatty foods. Avoid fast foods. Exercise encouraged - at least 150 minutes per week and advance as tolerated. Can also try red yeast rice and we will recheck in one year. Slight variation in labs on CBC. Not anemic, not iron deficient. All other labs normal.

## 2023-01-14 ENCOUNTER — Ambulatory Visit: Payer: Self-pay | Admitting: Family Medicine

## 2023-01-14 NOTE — Telephone Encounter (Signed)
  Chief Complaint: cough Symptoms: cough, green mucus, congestion, intermittent wheezing, fatigue Pertinent Negatives: Patient denies fever Disposition: [] ED /[] Urgent Care (no appt availability in office) / [x] Appointment(In office/virtual)/ []  [facility]/ [] Home Care/ [] Refused Recommended Disposition /[] [facility]/ []  Follow-up with PCP Additional Notes: Patient reports she was diagnosed with the flu at a virtual visit 1 week ago and prescribed Tamiflu. Patient reports she has since completed the medication but feels her symptoms have gotten worse. Patient reports coughing up green sputum, wheezing after coughing, fatigue, and congestion. Patient scheduled for appointment tomorrow 11/13.   Copied from CRM [id number]. Topic: Appointments - Appointment Scheduling >> Jan 14, 2023  3:31 PM Larwance Sachs wrote: Patient/patient representative is calling to schedule an appointment. Refer to attachments for appointment information.  Reason for Disposition  [place]  Answer Assessment - Initial Assessment Questions 1. DIAGNOSIS CONFIRMATION: "When was the influenza diagnosed?" "By whom?" "Did you get a test for it?"     Diagnosed via video call last week 2. MEDICINES: "Were you prescribed any medications for the influenza?"  (e.g., zanamivir [Relenza], oseltamavir [Tamiflu]).      Tamiflu completed 3. ONSET of SYMPTOMS: "When did your symptoms start?"     1.5 weeks ago 4. SYMPTOMS: "What symptoms are you most concerned about?" (e.g., runny nose, stuffy nose, sore throat, cough, breathing difficulty, fever)     Cough, stuffy nose, green mucus, fatigue 5. COUGH: "How bad is the cough?"     severe 6. FEVER: "Do you have a fever?" If Yes, ask: "What is your temperature, how was it measured, and when did it start?"     no 7. RESPIRATORY DISTRESS: "Are you having any trouble breathing?" If Yes, ask: "Describe your breathing."      Wheezing after cough 10. HIGH RISK for  COMPLICATIONS: "Do you have any heart or lung problems? Do you have a weakened immune system?" (e.g., CHF, COPD, asthma, HIV positive, chemotherapy, renal failure, diabetes mellitus, sickle cell anemia)       no  Protocols used: Influenza Follow-up Call-[facility]

## 2023-01-15 ENCOUNTER — Encounter: Payer: Self-pay | Admitting: Family Medicine

## 2023-01-15 ENCOUNTER — Ambulatory Visit: Payer: Medicaid Other | Admitting: Family Medicine

## 2023-01-15 DIAGNOSIS — Z20822 Contact with and (suspected) exposure to covid-19: Secondary | ICD-10-CM | POA: Diagnosis not present

## 2023-01-29 ENCOUNTER — Ambulatory Visit: Payer: BLUE CROSS/BLUE SHIELD | Admitting: Family Medicine

## 2023-01-29 ENCOUNTER — Encounter: Payer: Self-pay | Admitting: Family Medicine

## 2023-01-29 DIAGNOSIS — F419 Anxiety disorder, unspecified: Secondary | ICD-10-CM | POA: Insufficient documentation

## 2023-01-29 DIAGNOSIS — R5383 Other fatigue: Secondary | ICD-10-CM | POA: Insufficient documentation

## 2023-01-29 DIAGNOSIS — F322 Major depressive disorder, single episode, severe without psychotic features: Secondary | ICD-10-CM | POA: Insufficient documentation

## 2023-04-09 ENCOUNTER — Telehealth: Payer: Medicaid Other | Admitting: Physician Assistant

## 2023-04-09 DIAGNOSIS — R6889 Other general symptoms and signs: Secondary | ICD-10-CM | POA: Diagnosis not present

## 2023-04-09 MED ORDER — BENZONATATE 100 MG PO CAPS: 100.0000 mg | ORAL_CAPSULE | Freq: Three times a day (TID) | ORAL | 0 refills | Status: DC | PRN

## 2023-04-09 MED ORDER — OSELTAMIVIR PHOSPHATE 75 MG PO CAPS: 75.0000 mg | ORAL_CAPSULE | Freq: Two times a day (BID) | ORAL | 0 refills | Status: DC

## 2023-04-09 NOTE — Progress Notes (Signed)
 Message sent to patient requesting further input regarding current symptoms. Awaiting patient response.

## 2023-04-09 NOTE — Progress Notes (Signed)
E visit for Flu like symptoms   We are sorry that you are not feeling well.  Here is how we plan to help! Based on what you have shared with me it looks like you may have a respiratory virus that may be influenza.  Influenza or "the flu" is   an infection caused by a respiratory virus. The flu virus is highly contagious and persons who did not receive their yearly flu vaccination may "catch" the flu from close contact.  We have anti-viral medications to treat the viruses that cause this infection. They are not a "cure" and only shorten the course of the infection. These prescriptions are most effective when they are given within the first 2 days of "flu" symptoms. Antiviral medication are indicated if you have a high risk of complications from the flu. You should  also consider an antiviral medication if you are in close contact with someone who is at risk. These medications can help patients avoid complications from the flu  but have side effects that you should know. Possible side effects from Tamiflu or oseltamivir include nausea, vomiting, diarrhea, dizziness, headaches, eye redness, sleep problems or other respiratory symptoms. You should not take Tamiflu if you have an allergy to oseltamivir or any to the ingredients in Tamiflu.  Based upon your symptoms and potential risk factors I have prescribed Oseltamivir (Tamiflu).  It has been sent to your designated pharmacy.  You will take one 75 mg capsule orally twice a day for the next 5 days. and I recommend that you follow the flu symptoms recommendation that I have listed below. I have also sent in a prescription cough medication to take as directed.  Giving the chest pain with cough and breathing, if this is not quickly resolving with better control over the cough, or any worsening of this overnight, you must be evaluated in person ASAP.   ANYONE WHO HAS FLU SYMPTOMS SHOULD: Stay home. The flu is highly contagious and going out or to work exposes  others! Be sure to drink plenty of fluids. Water is fine as well as fruit juices, sodas and electrolyte beverages. You may want to stay away from caffeine or alcohol. If you are nauseated, try taking small sips of liquids. How do you know if you are getting enough fluid? Your urine should be a pale yellow or almost colorless. Get rest. Taking a steamy shower or using a humidifier may help nasal congestion and ease sore throat pain. Using a saline nasal spray works much the same way. Cough drops, hard candies and sore throat lozenges may ease your cough. Line up a caregiver. Have someone check on you regularly.   GET HELP RIGHT AWAY IF: You cannot keep down liquids or your medications. You become short of breath Your fell like you are going to pass out or loose consciousness. Your symptoms persist after you have completed your treatment plan MAKE SURE YOU  Understand these instructions. Will watch your condition. Will get help right away if you are not doing well or get worse.  Your e-visit answers were reviewed by a board certified advanced clinical practitioner to complete your personal care plan.  Depending on the condition, your plan could have included both over the counter or prescription medications.  If there is a problem please reply  once you have received a response from your provider.  Your safety is important to [name].  If you have drug allergies check your prescription carefully.    You can  use MyChart to ask questions about today's visit, request a non-urgent call back, or ask for a work or school excuse for 24 hours related to this e-Visit. If it has been greater than 24 hours you will need to follow up with your provider, or enter a new e-Visit to address those concerns.  You will get an e-mail in the next two days asking about your experience.  I hope that your e-visit has been valuable and will speed your recovery. Thank you for using e-visits.

## 2023-04-09 NOTE — Progress Notes (Signed)
 I have spent 5 minutes in review of e-visit questionnaire, review and updating patient chart, medical decision making and response to patient.   Piedad Climes, PA-C

## 2023-04-10 DIAGNOSIS — J101 Influenza due to other identified influenza virus with other respiratory manifestations: Secondary | ICD-10-CM | POA: Diagnosis not present

## 2023-04-10 DIAGNOSIS — R519 Headache, unspecified: Secondary | ICD-10-CM | POA: Diagnosis not present

## 2023-05-13 ENCOUNTER — Ambulatory Visit: Admitting: Family Medicine

## 2023-05-13 ENCOUNTER — Encounter: Payer: Self-pay | Admitting: Family Medicine

## 2023-05-13 VITALS — BP 97/62 | HR 86 | Temp 98.7°F | Ht 65.0 in | Wt 164.0 lb

## 2023-05-13 DIAGNOSIS — Z01818 Encounter for other preprocedural examination: Secondary | ICD-10-CM | POA: Diagnosis not present

## 2023-05-13 NOTE — Progress Notes (Signed)
 Subjective:  Patient ID: Vickie Lloyd, female    DOB: 1990/04/12, 33 y.o.   MRN: [id number]  Patient Care Team: Arrie Senate, FNP as PCP - General (Family Medicine)   Pt is a 33 y.o. female who is here for preoperative clearance for Lipo   1) High Risk Cardiac Conditions  1) Recent MI - No.  2) Decompensated Heart Failure - No.  3) Unstable angina - No.  4) Symptomatic arrythmia - No.  5) Sx Valvular Disease - No.  2) Intermediate Risk Factors - DM, CKD, CVA, CHF, CAD - No.  2) Functional Status - > 4 mets (Walk, run, climb stairs) Yes.  Marland Kitchen   3) Surgery Specific Risk - High  (Emergency, Vascular, Intra-abdominal, Extensive ops)          Intermediate (Carotid, Head and Neck, Orthopaedic )          Low (Endoscopic, Cataract, Breast )  4) Further Noninvasive evaluation -   1) EKG - Yes.   -requested by surgeon    1) Hx of CVA, CAD, DM, CKD  2) Echo - No.   1) Worsening dyspnea   3) Stress Testing - Active Cardiac Disease - No.  5) Need for medical therapy - Beta Blocker, Statins indicated ? No.  There are no diagnoses linked to this encounter.   Relevant past medical, surgical, family, and social history reviewed and updated as indicated.  Allergies and medications reviewed and updated. Data reviewed: Chart in Epic.   Past Medical History:  Diagnosis Date   Diabetes mellitus without complication (HCC)    Migraine     Past Surgical History:  Procedure Laterality Date   birthmark removed as a child     NO PAST SURGERIES      Social History   Socioeconomic History   Marital status: Divorced    Spouse name: Not on file   Number of children: 2   Years of education: Not on file   Highest education level: Not on file  Occupational History   Not on file  Tobacco Use   Smoking status: Never   Smokeless tobacco: Never  Vaping Use   Vaping status: Never Used  Substance and Sexual Activity   Alcohol use: Not Currently    Comment: rarely   Drug  use: No   Sexual activity: Yes    Birth control/protection: Condom, I.U.D.  Other Topics Concern   Not on file  Social History Narrative   Not on file   Social Drivers of Health   Financial Resource Strain: Low Risk  (03/02/2021)   Overall Financial Resource Strain (CARDIA)    Difficulty of Paying Living Expenses: Not very hard  Food Insecurity: No Food Insecurity (03/02/2021)   Hunger Vital Sign    Worried About Running Out of Food in the Last Year: Never true    Ran Out of Food in the Last Year: Never true  Transportation Needs: No Transportation Needs (03/02/2021)   PRAPARE - Administrator, Civil Service (Medical): No    Lack of Transportation (Non-Medical): No  Physical Activity: Insufficiently Active (03/02/2021)   Exercise Vital Sign    Days of Exercise per Week: 1 day    Minutes of Exercise per Session: 20 min  Stress: Stress Concern Present (03/02/2021)   [employer] of Occupational Health - Occupational Stress Questionnaire    Feeling of Stress : Rather much  Social Connections: Moderately Integrated (03/02/2021)   Social Connection  and Isolation Panel [NHANES]    Frequency of Communication with Friends and Family: Twice a week    Frequency of Social Gatherings with Friends and Family: Once a week    Attends Religious Services: 1 to 4 times per year    Active Member of [employer] or Organizations: No    Attends Banker Meetings: Never    Marital Status: Living with partner  Intimate Partner Violence: Not At Risk (03/02/2021)   Humiliation, Afraid, Rape, and Kick questionnaire    Fear of Current or Ex-Partner: No    Emotionally Abused: No    Physically Abused: No    Sexually Abused: No    Outpatient Encounter Medications as of 05/13/2023  Medication Sig   levonorgestrel (MIRENA) 20 MCG/DAY IUD 1 each by Intrauterine route once.   [DISCONTINUED] benzonatate (TESSALON) 100 MG capsule Take 1 capsule (100 mg total) by mouth 3 (three) times  daily as needed for cough.   [DISCONTINUED] FLUoxetine (PROZAC) 20 MG capsule Take 1 capsule (20 mg total) by mouth daily.   [DISCONTINUED] fluticasone (FLONASE) 50 MCG/ACT nasal spray Place 2 sprays into both nostrils daily.   [DISCONTINUED] hydrOXYzine (VISTARIL) 25 MG capsule Take 1 capsule (25 mg total) by mouth every 8 (eight) hours as needed.   [DISCONTINUED] oseltamivir (TAMIFLU) 75 MG capsule Take 1 capsule (75 mg total) by mouth 2 (two) times daily.   No facility-administered encounter medications on file as of 05/13/2023.    No Known Allergies  Review of Systems As per HPI  Objective:  BP 97/62 (Cuff Size: Normal)   Pulse 86   Temp 98.7 F (37.1 C)   Ht 5\' 5"  (1.651 m)   Wt 164 lb (74.4 kg)   SpO2 98%   Breastfeeding No   BMI 27.29 kg/m    Wt Readings from Last 3 Encounters:  05/13/23 164 lb (74.4 kg)  01/01/23 164 lb (74.4 kg)  03/30/21 163 lb (73.9 kg)   Physical Exam Constitutional:      General: She is awake. She is not in acute distress.    Appearance: Normal appearance. She is well-developed, well-groomed and overweight. She is not ill-appearing, toxic-appearing or diaphoretic.  Cardiovascular:     Rate and Rhythm: Normal rate and regular rhythm.     Pulses: Normal pulses.          Radial pulses are 2+ on the right side and 2+ on the left side.       Posterior tibial pulses are 2+ on the right side and 2+ on the left side.     Heart sounds: Normal heart sounds. No murmur heard.    No gallop.  Pulmonary:     Effort: Pulmonary effort is normal. No respiratory distress.     Breath sounds: Normal breath sounds. No stridor. No wheezing, rhonchi or rales.  Musculoskeletal:     Cervical back: Full passive range of motion without pain and neck supple.     Right lower leg: No edema.     Left lower leg: No edema.  Skin:    General: Skin is warm.     Capillary Refill: Capillary refill takes less than 2 seconds.  Neurological:     General: No focal deficit  present.     Mental Status: She is alert, oriented to person, place, and time and easily aroused. Mental status is at baseline.     GCS: GCS eye subscore is 4. GCS verbal subscore is 5. GCS motor subscore is 6.  Motor: No weakness.  Psychiatric:        Attention and Perception: Attention and perception normal.        Mood and Affect: Mood and affect normal.        Speech: Speech normal.        Behavior: Behavior normal. Behavior is cooperative.        Thought Content: Thought content normal. Thought content does not include homicidal or suicidal ideation. Thought content does not include homicidal or suicidal plan.        Cognition and Memory: Cognition and memory normal.        Judgment: Judgment normal.     Results for orders placed or performed in visit on 01/06/23  VITAMIN D 25 Hydroxy (Vit-D Deficiency, Fractures)   Collection Time: 01/06/23  9:29 AM  Result Value Ref Range   Vit D, 25-Hydroxy 56.1 30.0 - 100.0 ng/mL  Thyroid Panel With TSH   Collection Time: 01/06/23  9:29 AM  Result Value Ref Range   TSH 1.190 0.450 - 4.500 uIU/mL   T4, Total 7.4 4.5 - 12.0 ug/dL   T3 Uptake Ratio 25 24 - 39 %   Free Thyroxine Index 1.9 1.2 - 4.9  Lipid panel   Collection Time: 01/06/23  9:29 AM  Result Value Ref Range   Cholesterol, Total 163 100 - 199 mg/dL   Triglycerides 161 0 - 149 mg/dL   HDL 43 >09 mg/dL   VLDL Cholesterol Cal 19 5 - 40 mg/dL   LDL Chol Calc (NIH) 604 (H) 0 - 99 mg/dL   Chol/HDL Ratio 3.8 0.0 - 4.4 ratio  CMP14+EGFR   Collection Time: 01/06/23  9:29 AM  Result Value Ref Range   Glucose 90 70 - 99 mg/dL   BUN 8 6 - 20 mg/dL   Creatinine, Ser 5.40 0.57 - 1.00 mg/dL   eGFR 981 >19 JY/NWG/9.56   BUN/Creatinine Ratio 11 9 - 23   Sodium 137 134 - 144 mmol/L   Potassium 4.0 3.5 - 5.2 mmol/L   Chloride 103 96 - 106 mmol/L   CO2 23 20 - 29 mmol/L   Calcium 9.1 8.7 - 10.2 mg/dL   Total Protein 6.9 6.0 - 8.5 g/dL   Albumin 4.2 3.9 - 4.9 g/dL   Globulin, Total  2.7 1.5 - 4.5 g/dL   Bilirubin Total 0.8 0.0 - 1.2 mg/dL   Alkaline Phosphatase 69 44 - 121 IU/L   AST 22 0 - 40 IU/L   ALT 15 0 - 32 IU/L  Anemia Profile B   Collection Time: 01/06/23  9:29 AM  Result Value Ref Range   Total Iron Binding Capacity 363 250 - 450 ug/dL   UIBC 213 086 - 578 ug/dL   Iron 469 27 - 629 ug/dL   Iron Saturation 28 15 - 55 %   Ferritin 33 15 - 150 ng/mL   Vitamin B-12 1,193 232 - 1,245 pg/mL   Folate 5.3 >3.0 ng/mL   WBC 10.5 3.4 - 10.8 x10E3/uL   RBC 4.97 3.77 - 5.28 x10E6/uL   Hemoglobin 13.7 11.1 - 15.9 g/dL   Hematocrit 52.8 41.3 - 46.6 %   MCV 89 79 - 97 fL   MCH 27.6 26.6 - 33.0 pg   MCHC 31.1 (L) 31.5 - 35.7 g/dL   RDW 24.4 01.0 - 27.2 %   Platelets 253 150 - 450 x10E3/uL   Neutrophils 80 Not Estab. %   Lymphs 12 Not Estab. %   Monocytes  6 Not Estab. %   Eos 1 Not Estab. %   Basos 1 Not Estab. %   Neutrophils Absolute 8.5 (H) 1.4 - 7.0 x10E3/uL   Lymphocytes Absolute 1.2 0.7 - 3.1 x10E3/uL   Monocytes Absolute 0.6 0.1 - 0.9 x10E3/uL   EOS (ABSOLUTE) 0.1 0.0 - 0.4 x10E3/uL   Basophils Absolute 0.1 0.0 - 0.2 x10E3/uL   Immature Granulocytes 0 Not Estab. %   Immature Grans (Abs) 0.0 0.0 - 0.1 x10E3/uL   Retic Ct Pct 1.7 0.6 - 2.6 %       05/13/2023    3:07 PM 01/01/2023   10:55 AM 07/12/2020   10:17 AM 09/30/2017    5:22 PM 08/06/2017    2:22 PM  Depression screen PHQ 2/9  Decreased Interest 0 3 0 0 0  Down, Depressed, Hopeless 0 3 0 0 0  PHQ - 2 Score 0 6 0 0 0  Altered sleeping 1 3 0    Tired, decreased energy 1 3 0    Change in appetite 0 3 0    Feeling bad or failure about yourself  0 3 0    Trouble concentrating 0 2 0    Moving slowly or fidgety/restless 0 2 0    Suicidal thoughts 0 0 0    PHQ-9 Score 2 22 0    Difficult doing work/chores  Very difficult          05/13/2023    3:08 PM 01/01/2023   10:55 AM 07/12/2020   10:17 AM  GAD 7 : Generalized Anxiety Score  Nervous, Anxious, on Edge 0 3 0  Control/stop worrying 0 3 0   Worry too much - different things 0 3 0  Trouble relaxing 0 3 0  Restless 0 1 0  Easily annoyed or irritable 0 3 0  Afraid - awful might happen 0 3 0  Total GAD 7 Score 0 19 0  Anxiety Difficulty Not difficult at all Very difficult    Pertinent labs & imaging results that were available during my care of the patient were reviewed by me and considered in my medical decision making.  Assessment & Plan:  Diagnoses and all orders for this visit:  Preop examination -     EKG 12-Lead Reviewed EKG. NSR. No acute findings.    I have independently evaluated patient.  RYELEE ALBEE is a 33 y.o. female who is low risk for a low risk surgery.  There are not modifiable risk factors (smoking, etc). Jaslene A Odriscoll's RCRI/NSQIP calculation for MACE and DASI:   Duke Activity Status Index (DASI) from [email or web address]  on 05/13/2023 ** All calculations should be rechecked by clinician prior to use **  RESULT SUMMARY: 58.2 points The higher the score (maximum 58.2), the higher the functional status.  9.89 METs  INPUTS: Take care of self --> 2.75 = Yes Walk indoors --> 1.75 = Yes Walk 1&ndash;2 blocks on level ground --> 2.75 = Yes Climb a flight of stairs or walk up a hill --> 5.5 = Yes Run a short distance --> 8 = Yes Do light work around the house --> 2.7 = Yes Do moderate work around the house --> 3.5 = Yes Do heavy work around the house --> 8 = Yes Do yardwork --> 4.5 = Yes Have sexual relations --> 5.25 = Yes Participate in moderate recreational activities --> 6 = Yes Participate in strenuous sports --> 7.5 = Yes  Revised Cardiac Risk Index for Pre-Operative Risk from  [email or web address]  on 05/13/2023 ** All calculations should be rechecked by clinician prior to use **  RESULT SUMMARY: 0 points RCRI Score  3.9 % Risk of major cardiac event   INPUTS: High-risk surgery --> 0 = No History of ischemic heart disease --> 0 = No History of congestive heart failure --> 0 = No History of  cerebrovascular disease --> 0 = No Pre-operative treatment with insulin --> 0 = No Pre-operative creatinine >2 mg/dL / 098.1 mol/L --> 0 = No   Not indicated. CXR (if asx/healthy/no resp issues don't need) Requested from surgeon. EKG (?h/o MI, CAD, etc; usually don't need for low risk sx) Not indicated PFTs (significant cardiopulm hx? OSA/OHS) Not indicated. Echo (get if CHF and has not had in >1 year OR if worse HF symptoms) Review meds: NO ACE-I/ARB day of surgery. OK to do BB or statin day of esp if vascular sx). No medications needed to hold.   Continue all other maintenance medications.  Follow up plan: Return if symptoms worsen or fail to improve.  Continue healthy lifestyle choices, including diet (rich in fruits, vegetables, and lean proteins, and low in salt and simple carbohydrates) and exercise (at least 30 minutes of moderate physical activity daily).  Written and verbal instructions provided   The above assessment and management plan was discussed with the patient. The patient verbalized understanding of and has agreed to the management plan. Patient is aware to call the clinic if they develop any new symptoms or if symptoms persist or worsen. Patient is aware when to return to the clinic for a follow-up visit. Patient educated on when it is appropriate to go to the emergency department.   Neale Burly, DNP-FNP [facility] [address] [place], [place] [address] [phone]

## 2023-06-11 IMAGING — MR MR ABDOMEN W/O CM
13 of 14 series · 38 of 48 positions shown · non-contrast
Comparison: None.

CLINICAL DATA: Pregnancy with positive fetal motion. Right lower
quadrant abdominal pain and back pain beginning this afternoon.

EXAM:
MRI ABDOMEN AND PELVIS WITHOUT CONTRAST
TECHNIQUE: Multiplanar multisequence MR imaging of the abdomen and pelvis was
performed. No intravenous contrast was administered.

[Series 3: cor haste · coronal · 5.0mm · 1.17mm/px · 2 of 42 slices shown]
[im 1/42]
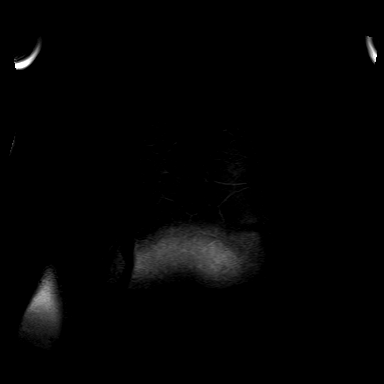
[im 42/42]
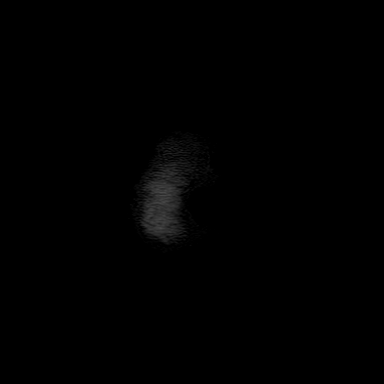

[Series 7: bSSFP · coronal · 5.0mm · 2.01mm/px · 2 of 42 slices shown (1 of 3)]
[im 1/42]
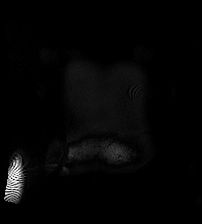
[im 42/42]
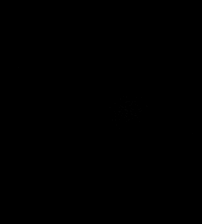

[Series 10: cor haste fs · coronal · 5.0mm · 1.17mm/px · 2 of 41 slices shown]
[im 1/41]
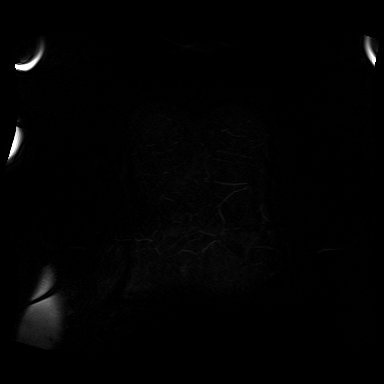
[im 41/41]
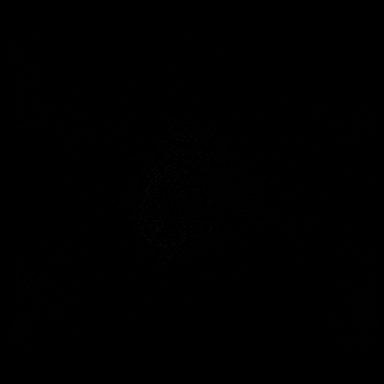

[Series 15: ax haste_comp · axial · 5.0mm · 1.17mm/px · z∈[+5,+233]mm · 3 of 39 slices shown (1 of 2)]
[im 1/39]
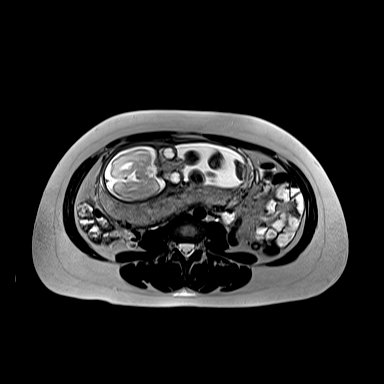
[im 20/39]
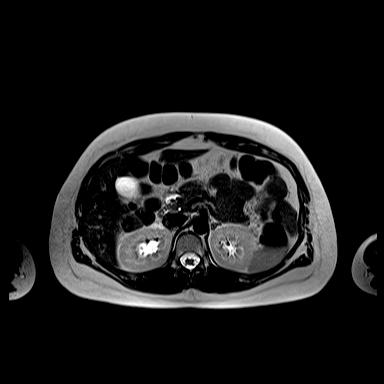
[im 39/39]
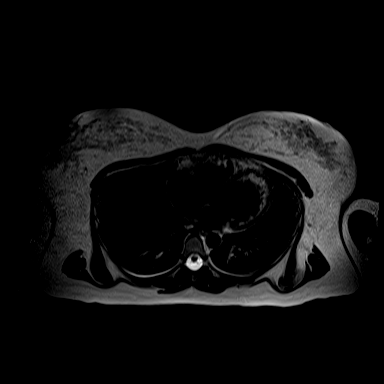

[Series 15: ax haste_comp · axial · 5.0mm · 1.41mm/px · z∈[-229,-1]mm · 3 of 39 slices shown (2 of 2)]
[im 1/39]
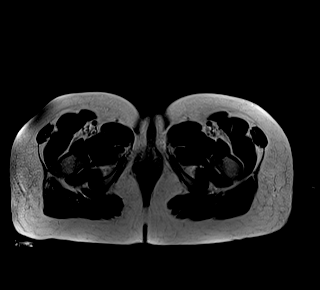
[im 20/39]
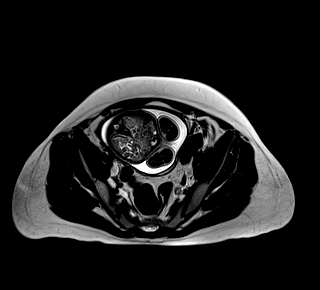
[im 39/39]
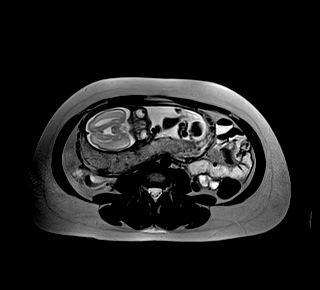

[Series 20: ax haste fs_comp · axial · 5.0mm · 1.41mm/px · z∈[-229,-1]mm · 3 of 39 slices shown (1 of 2)]
[im 1/39]
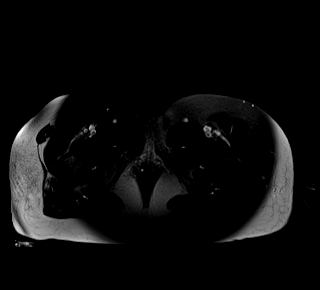
[im 20/39]
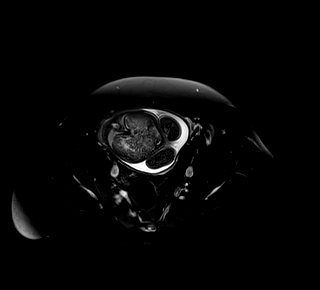
[im 39/39]
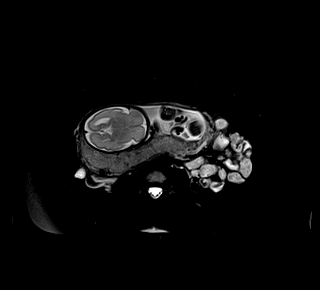

[Series 20: ax haste fs_comp · axial · 5.0mm · 1.17mm/px · z∈[+5,+233]mm · 3 of 39 slices shown (2 of 2)]
[im 1/39]
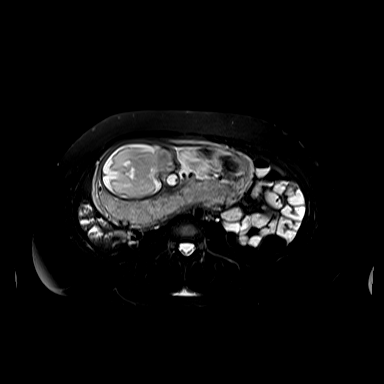
[im 20/39]
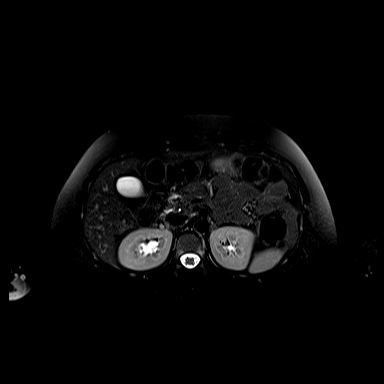
[im 39/39]
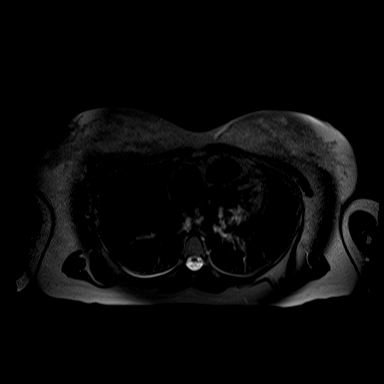

[Series 23: T2 fat-sat · axial · 5.0mm · 1.41mm/px · z∈[-229,+233]mm · 6 of 78 slices shown]
[im 1/78]
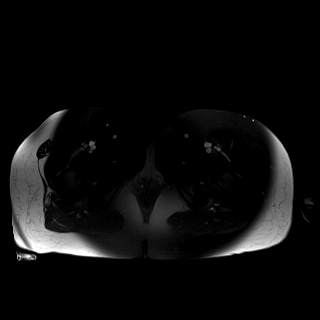
[im 16/78]
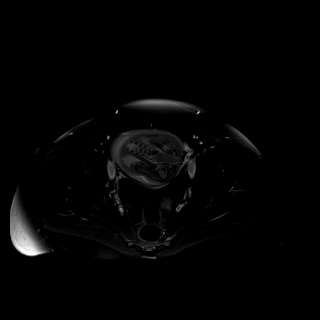
[im 31/78]
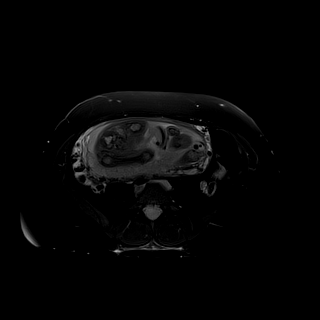
[im 47/78]
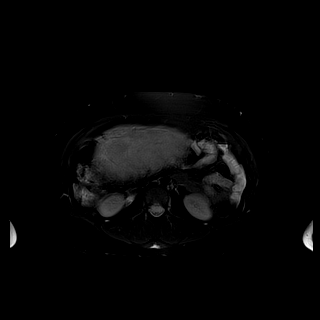
[im 62/78]
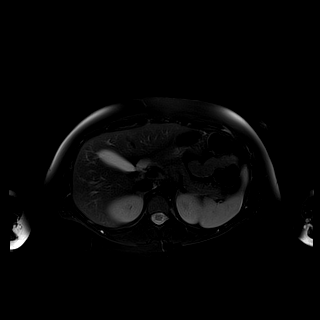
[im 78/78]
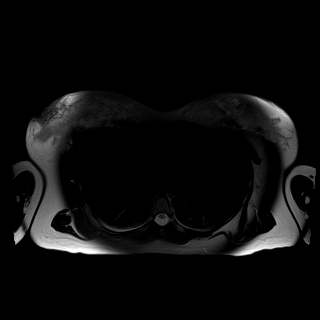

[Series 26: bSSFP · axial · 5.0mm · 0.88mm/px · z∈[-226,-4]mm · 3 of 38 slices shown (2 of 3)]
[im 1/38]
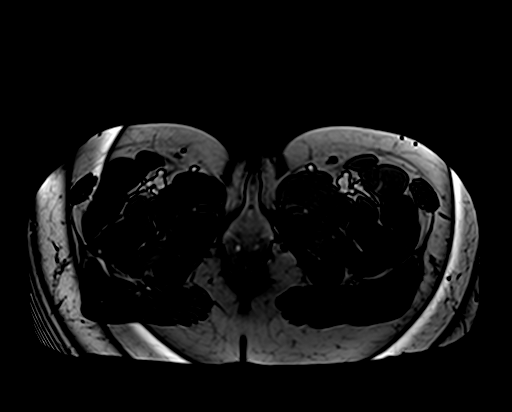
[im 19/38]
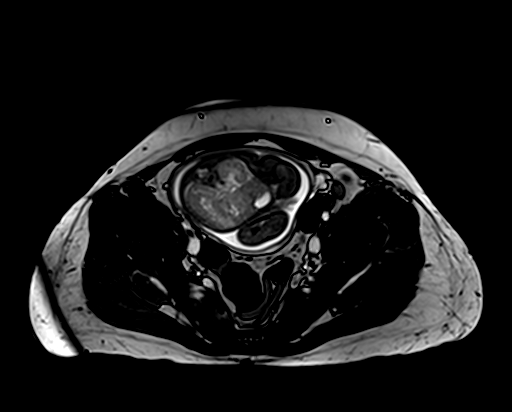
[im 38/38]
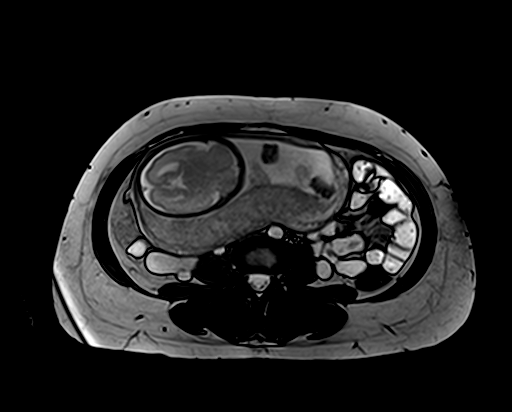

[Series 26: bSSFP · axial · 5.0mm · 2.01mm/px · z∈[+2,+224]mm · 3 of 38 slices shown (3 of 3)]
[im 1/38]
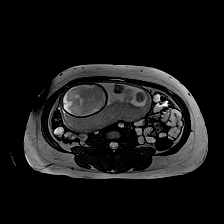
[im 19/38]
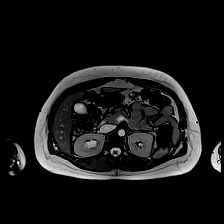
[im 38/38]
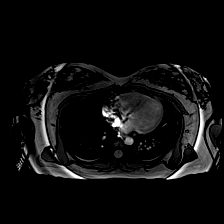

[Series 29: T1 · axial · 5.0mm · 1.76mm/px · z∈[-229,-1]mm · 3 of 39 slices shown (1 of 2)]
[im 1/39]
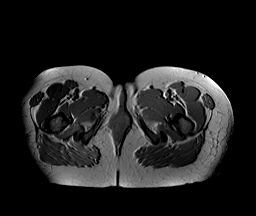
[im 20/39]
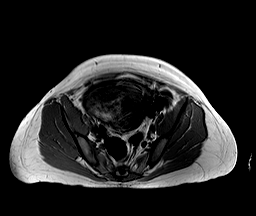
[im 39/39]
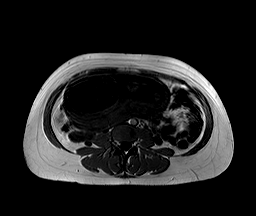

[Series 29: T1 · axial · 5.0mm · 0.88mm/px · z∈[+5,+233]mm · 3 of 39 slices shown (2 of 2)]
[im 1/39]
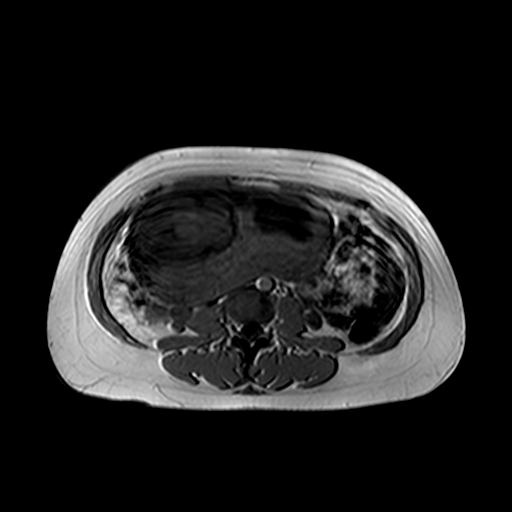
[im 20/39]
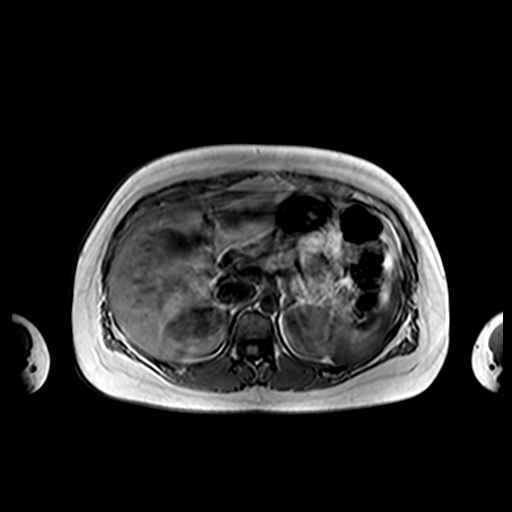
[im 39/39]
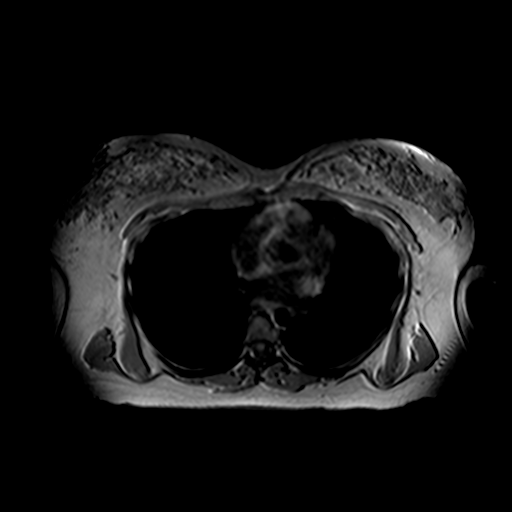

[Series 30: T1 dynamic · axial · 3.0mm · 1.41mm/px · z∈[+9,+54]mm · 2 of 80 slices shown]
[im 1/80]
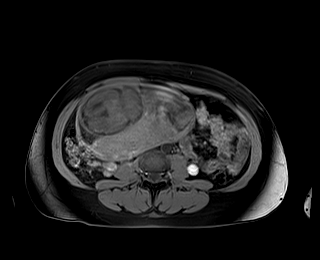
[im 16/80]
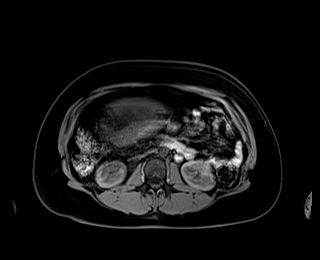

[38 of 48 positions shown; findings below may reference images not displayed]

FINDINGS: COMBINED FINDINGS FOR BOTH MR ABDOMEN AND PELVIS

Lower chest: Visualized lung apices are unremarkable. No pleural
effusions. Visualized esophagus is decompressed.

Hepatobiliary: No mass or other parenchymal abnormality identified.

Pancreas: No mass, inflammatory changes, or other parenchymal
abnormality identified.

Spleen:  Within normal limits in size and appearance.

Adrenals/Urinary Tract: No adrenal gland nodules. Kidneys are
symmetrical in size. Mild prominence of the renal collecting system
and ureter on the right, likely related to extrinsic compression
from the pregnancy. No focal mass identified.

Stomach/Bowel: Stomach, small bowel, and colon are not abnormally
distended. No wall thickening or inflammatory changes are
identified. The appendix is not definitively identified but no
inflammatory changes or fluid collections are demonstrated in the
right lower quadrant that would suggest evidence of appendicitis.

Vascular/Lymphatic: No pathologically enlarged lymph nodes
identified. No abdominal aortic aneurysm demonstrated.

Reproductive: Gravid uterus with single intrauterine pregnancy.
Fetus is in breech presentation. The placenta is posterior and
fundal. No evidence of abruption. Small cyst in the right ovary. No
abnormal adnexal masses.

Other: No free fluid in the abdomen or pelvis. Abdominal wall
musculature appears intact.

Musculoskeletal: Marrow signal intensities are homogeneous without
focal bone lesion identified.
IMPRESSION: 1. Single intrauterine pregnancy. Fetus in breech presentation.
Placenta is posterior and fundal without evidence of abruption.
2. Small cyst in the right ovary.  No abnormal adnexal masses.
3. Appendix is not definitively identified but no fluid collection
or inflammatory stranding are demonstrated in the right lower
quadrant.
4. Mildly dilated right renal collecting system, likely due to
extrinsic compression from the pregnancy.

## 2023-07-29 DIAGNOSIS — R051 Acute cough: Secondary | ICD-10-CM | POA: Diagnosis not present

## 2023-08-06 DIAGNOSIS — N23 Unspecified renal colic: Secondary | ICD-10-CM | POA: Diagnosis not present

## 2023-08-06 DIAGNOSIS — R319 Hematuria, unspecified: Secondary | ICD-10-CM | POA: Diagnosis not present

## 2023-08-13 ENCOUNTER — Ambulatory Visit (HOSPITAL_COMMUNITY)
Admission: RE | Admit: 2023-08-13 | Discharge: 2023-08-13 | Disposition: A | Discharge status: Home / Self Care | Source: Ambulatory Visit | Attending: Family Medicine | Admitting: Family Medicine

## 2023-08-13 ENCOUNTER — Ambulatory Visit: Admitting: Family Medicine

## 2023-08-13 ENCOUNTER — Encounter: Payer: Self-pay | Admitting: Family Medicine

## 2023-08-13 ENCOUNTER — Ambulatory Visit: Payer: Self-pay | Admitting: Family Medicine

## 2023-08-13 VITALS — BP 91/61 | HR 71 | Temp 98.3°F | Ht 65.0 in | Wt 165.0 lb

## 2023-08-13 DIAGNOSIS — R1900 Intra-abdominal and pelvic swelling, mass and lump, unspecified site: Secondary | ICD-10-CM | POA: Diagnosis not present

## 2023-08-13 DIAGNOSIS — K429 Umbilical hernia without obstruction or gangrene: Secondary | ICD-10-CM | POA: Insufficient documentation

## 2023-08-13 NOTE — Progress Notes (Signed)
 Fat containing hernia. Urgent referral to General Surgery placed. If becomes hard or more painful while waiting for appt, please follow up in ED.

## 2023-08-13 NOTE — Progress Notes (Addendum)
 Subjective:  Patient ID: Vickie Lloyd, female    DOB: 10/04/1990, 33 y.o.   MRN: [id number]  Patient Care Team: Chrystine Crate, FNP as PCP - General (Family Medicine)   Chief Complaint:  Hernia  HPI: Vickie Lloyd is a 33 y.o. female presenting on 08/13/2023 for Hernia  HPI States that she was previously diagnosed a few months ago with a hernia. States that it is getting worse. She is having more pain especially with twisting. Reports that it protrudes more after eating and is harder to push in after eating. Reports that a few months ago she received lipo and feels that it has gotten worse since then. She states that she is increasingly nauseous as well. Still having gas and bowel movements.   Relevant past medical, surgical, family, and social history reviewed and updated as indicated.  Allergies and medications reviewed and updated. Data reviewed: Chart in Epic.   Past Medical History:  Diagnosis Date   Diabetes mellitus without complication (HCC)    Migraine     Past Surgical History:  Procedure Laterality Date   birthmark removed as a child     NO PAST SURGERIES      Social History   Socioeconomic History   Marital status: Divorced    Spouse name: Not on file   Number of children: 2   Years of education: Not on file   Highest education level: Not on file  Occupational History   Not on file  Tobacco Use   Smoking status: Never   Smokeless tobacco: Never  Vaping Use   Vaping status: Never Used  Substance and Sexual Activity   Alcohol use: Not Currently    Comment: rarely   Drug use: No   Sexual activity: Yes    Birth control/protection: Condom, I.U.D.  Other Topics Concern   Not on file  Social History Narrative   Not on file   Social Drivers of Health   Financial Resource Strain: Low Risk  (03/02/2021)   Overall Financial Resource Strain (CARDIA)    Difficulty of Paying Living Expenses: Not very hard  Food Insecurity: No Food Insecurity  (03/02/2021)   Hunger Vital Sign    Worried About Running Out of Food in the Last Year: Never true    Ran Out of Food in the Last Year: Never true  Transportation Needs: No Transportation Needs (03/02/2021)   PRAPARE - Administrator, Civil Service (Medical): No    Lack of Transportation (Non-Medical): No  Physical Activity: Insufficiently Active (03/02/2021)   Exercise Vital Sign    Days of Exercise per Week: 1 day    Minutes of Exercise per Session: 20 min  Stress: Stress Concern Present (03/02/2021)   [employer] of Occupational Health - Occupational Stress Questionnaire    Feeling of Stress : Rather much  Social Connections: Moderately Integrated (03/02/2021)   Social Connection and Isolation Panel [NHANES]    Frequency of Communication with Friends and Family: Twice a week    Frequency of Social Gatherings with Friends and Family: Once a week    Attends Religious Services: 1 to 4 times per year    Active Member of [employer] or Organizations: No    Attends Banker Meetings: Never    Marital Status: Living with partner  Intimate Partner Violence: Not At Risk (03/02/2021)   Humiliation, Afraid, Rape, and Kick questionnaire    Fear of Current or Ex-Partner: No    Emotionally  Abused: No    Physically Abused: No    Sexually Abused: No    Outpatient Encounter Medications as of 08/13/2023  Medication Sig   levonorgestrel  (MIRENA ) 20 MCG/DAY IUD 1 each by Intrauterine route once.   No facility-administered encounter medications on file as of 08/13/2023.    No Known Allergies  Review of Systems As per HPI  Objective:  BP 91/61   Pulse 71   Temp 98.3 F (36.8 C)   Ht 5' 5 (1.651 m)   Wt 165 lb (74.8 kg)   SpO2 100%   BMI 27.46 kg/m    Wt Readings from Last 3 Encounters:  08/13/23 165 lb (74.8 kg)  05/13/23 164 lb (74.4 kg)  01/01/23 164 lb (74.4 kg)    Physical Exam Constitutional:      General: She is awake. She is not in acute  distress.    Appearance: Normal appearance. She is well-developed and well-groomed. She is not ill-appearing, toxic-appearing or diaphoretic.  Cardiovascular:     Rate and Rhythm: Normal rate and regular rhythm.     Heart sounds: Normal heart sounds. No murmur heard.    No gallop.  Pulmonary:     Effort: Pulmonary effort is normal. No respiratory distress.     Breath sounds: Normal breath sounds. No stridor. No wheezing, rhonchi or rales.  Abdominal:     Palpations: Abdomen is soft.     Tenderness: There is abdominal tenderness.     Hernia: A hernia is present. Hernia is present in the umbilical area.       Comments: Umbilical hernia tender to palpation, difficult to reduce   Musculoskeletal:     Cervical back: Full passive range of motion without pain and neck supple.  Skin:    General: Skin is warm.     Capillary Refill: Capillary refill takes less than 2 seconds.  Neurological:     General: No focal deficit present.     Mental Status: She is alert, oriented to person, place, and time and easily aroused. Mental status is at baseline.     GCS: GCS eye subscore is 4. GCS verbal subscore is 5. GCS motor subscore is 6.     Motor: No weakness.  Psychiatric:        Attention and Perception: Attention and perception normal.        Mood and Affect: Mood and affect normal.        Speech: Speech normal.        Behavior: Behavior normal. Behavior is cooperative.        Thought Content: Thought content normal. Thought content does not include homicidal or suicidal ideation. Thought content does not include homicidal or suicidal plan.        Cognition and Memory: Cognition and memory normal.        Judgment: Judgment normal.     Results for orders placed or performed in visit on 01/06/23  VITAMIN D  25 Hydroxy (Vit-D Deficiency, Fractures)   Collection Time: 01/06/23  9:29 AM  Result Value Ref Range   Vit D, 25-Hydroxy 56.1 30.0 - 100.0 ng/mL  Thyroid  Panel With TSH   Collection Time:  01/06/23  9:29 AM  Result Value Ref Range   TSH 1.190 0.450 - 4.500 uIU/mL   T4, Total 7.4 4.5 - 12.0 ug/dL   T3 Uptake Ratio 25 24 - 39 %   Free Thyroxine Index 1.9 1.2 - 4.9  Lipid panel   Collection Time: 01/06/23  9:29 AM  Result Value Ref Range   Cholesterol, Total 163 100 - 199 mg/dL   Triglycerides 161 0 - 149 mg/dL   HDL 43 >09 mg/dL   VLDL Cholesterol Cal 19 5 - 40 mg/dL   LDL Chol Calc (NIH) 604 (H) 0 - 99 mg/dL   Chol/HDL Ratio 3.8 0.0 - 4.4 ratio  CMP14+EGFR   Collection Time: 01/06/23  9:29 AM  Result Value Ref Range   Glucose 90 70 - 99 mg/dL   BUN 8 6 - 20 mg/dL   Creatinine, Ser 5.40 0.57 - 1.00 mg/dL   eGFR 981 >19 JY/NWG/9.56   BUN/Creatinine Ratio 11 9 - 23   Sodium 137 134 - 144 mmol/L   Potassium 4.0 3.5 - 5.2 mmol/L   Chloride 103 96 - 106 mmol/L   CO2 23 20 - 29 mmol/L   Calcium 9.1 8.7 - 10.2 mg/dL   Total Protein 6.9 6.0 - 8.5 g/dL   Albumin 4.2 3.9 - 4.9 g/dL   Globulin, Total 2.7 1.5 - 4.5 g/dL   Bilirubin Total 0.8 0.0 - 1.2 mg/dL   Alkaline Phosphatase 69 44 - 121 IU/L   AST 22 0 - 40 IU/L   ALT 15 0 - 32 IU/L  Anemia Profile B   Collection Time: 01/06/23  9:29 AM  Result Value Ref Range   Total Iron Binding Capacity 363 250 - 450 ug/dL   UIBC 213 086 - 578 ug/dL   Iron 469 27 - 629 ug/dL   Iron Saturation 28 15 - 55 %   Ferritin 33 15 - 150 ng/mL   Vitamin B-12 1,193 232 - 1,245 pg/mL   Folate 5.3 >3.0 ng/mL   WBC 10.5 3.4 - 10.8 x10E3/uL   RBC 4.97 3.77 - 5.28 x10E6/uL   Hemoglobin 13.7 11.1 - 15.9 g/dL   Hematocrit 52.8 41.3 - 46.6 %   MCV 89 79 - 97 fL   MCH 27.6 26.6 - 33.0 pg   MCHC 31.1 (L) 31.5 - 35.7 g/dL   RDW 24.4 01.0 - 27.2 %   Platelets 253 150 - 450 x10E3/uL   Neutrophils 80 Not Estab. %   Lymphs 12 Not Estab. %   Monocytes 6 Not Estab. %   Eos 1 Not Estab. %   Basos 1 Not Estab. %   Neutrophils Absolute 8.5 (H) 1.4 - 7.0 x10E3/uL   Lymphocytes Absolute 1.2 0.7 - 3.1 x10E3/uL   Monocytes Absolute 0.6 0.1 - 0.9  x10E3/uL   EOS (ABSOLUTE) 0.1 0.0 - 0.4 x10E3/uL   Basophils Absolute 0.1 0.0 - 0.2 x10E3/uL   Immature Granulocytes 0 Not Estab. %   Immature Grans (Abs) 0.0 0.0 - 0.1 x10E3/uL   Retic Ct Pct 1.7 0.6 - 2.6 %       05/13/2023    3:07 PM 01/01/2023   10:55 AM 07/12/2020   10:17 AM 09/30/2017    5:22 PM 08/06/2017    2:22 PM  Depression screen PHQ 2/9  Decreased Interest 0 3 0 0 0  Down, Depressed, Hopeless 0 3 0 0 0  PHQ - 2 Score 0 6 0 0 0  Altered sleeping 1 3 0    Tired, decreased energy 1 3 0    Change in appetite 0 3 0    Feeling bad or failure about yourself  0 3 0    Trouble concentrating 0 2 0    Moving slowly or fidgety/restless 0 2 0    Suicidal thoughts 0 0 0  PHQ-9 Score 2 22 0    Difficult doing work/chores  Very difficult          05/13/2023    3:08 PM 01/01/2023   10:55 AM 07/12/2020   10:17 AM  GAD 7 : Generalized Anxiety Score  Nervous, Anxious, on Edge 0 3 0  Control/stop worrying 0 3 0  Worry too much - different things 0 3 0  Trouble relaxing 0 3 0  Restless 0 1 0  Easily annoyed or irritable 0 3 0  Afraid - awful might happen 0 3 0  Total GAD 7 Score 0 19 0  Anxiety Difficulty Not difficult at all Very difficult    Pertinent labs & imaging results that were available during my care of the patient were reviewed by me and considered in my medical decision making.  Assessment & Plan:  Chantale was seen today for hernia.  Diagnoses and all orders for this visit:  Umbilical hernia without obstruction and without gangrene Stat Imaging as below. Will communicate results to patient once available. Will await results to determine next steps. If any evidence of incarceration, will instruct patient to report to ED. Urgent referral to General surgery. Discussed red flags with patient.  -     US  Abdomen Complete; Future -     Ambulatory referral to General Surgery  Continue all other maintenance medications.  Follow up plan: Return if symptoms worsen or  fail to improve.   Continue healthy lifestyle choices, including diet (rich in fruits, vegetables, and lean proteins, and low in salt and simple carbohydrates) and exercise (at least 30 minutes of moderate physical activity daily).  Written and verbal instructions provided   The above assessment and management plan was discussed with the patient. The patient verbalized understanding of and has agreed to the management plan. Patient is aware to call the clinic if they develop any new symptoms or if symptoms persist or worsen. Patient is aware when to return to the clinic for a follow-up visit. Patient educated on when it is appropriate to go to the emergency department.   Jacqualyn Mates, DNP-FNP [facility] [address] [place], [place] [address] [phone]

## 2023-09-09 ENCOUNTER — Ambulatory Visit: Admitting: General Surgery

## 2023-10-08 DIAGNOSIS — R519 Headache, unspecified: Secondary | ICD-10-CM | POA: Diagnosis not present

## 2023-10-09 ENCOUNTER — Encounter: Payer: Self-pay | Admitting: Family Medicine

## 2023-10-09 ENCOUNTER — Ambulatory Visit: Payer: Self-pay

## 2023-10-09 ENCOUNTER — Ambulatory Visit: Admitting: Family Medicine

## 2023-10-09 VITALS — BP 130/83 | HR 102 | Temp 97.6°F | Ht 65.0 in | Wt 168.2 lb

## 2023-10-09 DIAGNOSIS — G43E11 Chronic migraine with aura, intractable, with status migrainosus: Secondary | ICD-10-CM

## 2023-10-09 MED ORDER — TOPIRAMATE 25 MG PO TABS: ORAL_TABLET | ORAL | 0 refills | Status: AC

## 2023-10-09 MED ORDER — RIZATRIPTAN BENZOATE 10 MG PO TBDP: 10.0000 mg | ORAL_TABLET | ORAL | 0 refills | Status: AC | PRN

## 2023-10-09 NOTE — Telephone Encounter (Signed)
 FYI Only or Action Required?: FYI only for provider.  Patient was last seen in primary care on 08/13/2023 by Cathlene Marry Lenis, FNP.  Called Nurse Triage reporting Headache.  Symptoms began several days ago.  Interventions attempted: Prescription medications: Naproxen.  Symptoms are: unchanged.  Triage Disposition: See HCP Within 4 Hours (Or PCP Triage)  Patient/caregiver understands and will follow disposition?: Yes  **Appt scheduled for 8/7**                  Copied from CRM #[id number]. Topic: Clinical - Red Word Triage >> Oct 09, 2023  1:46 PM Emylou G wrote: [facility] that prompted transfer to Nurse Triage: F/u ER for migraine Reason for Disposition  [1] SEVERE headache (e.g., excruciating) AND [2] not improved after 2 hours of pain medicine  Answer Assessment - Initial Assessment Questions 1. LOCATION: Where does it hurt?       Front of head, BIL pain behind the eyes  2. ONSET: When did the headache start? (e.g., minutes, hours, days)      Last Friday  3. PATTERN: Does the pain come and go, or has it been constant since it started?     Constant   4. SEVERITY: How bad is the pain? and What does it keep you from doing?  (e.g., Scale 1-10; mild, moderate, or severe)     8/10  5. RECURRENT SYMPTOM: Have you ever had headaches before? If Yes, ask: When was the last time? and What happened that time?      Yes  6. CAUSE: What do you think is causing the headache?     Dx. Of migraines   7. MIGRAINE: Have you been diagnosed with migraine headaches? If Yes, ask: Is this headache similar?      Yes  8. HEAD INJURY: Has there been any recent injury to your head?      No  9. OTHER SYMPTOMS: Do you have any other symptoms? (e.g., fever, stiff neck, eye pain, sore throat, cold symptoms)     No   10. PREGNANCY: Is there any chance you are pregnant? When was your last menstrual period?       No    Patient was given pain med.  In IV, and prescribed Naproxen, no relief.  Protocols used: [facility]

## 2023-10-09 NOTE — Telephone Encounter (Signed)
 noted

## 2023-10-09 NOTE — Progress Notes (Signed)
 Subjective:  Patient ID: Vickie Lloyd, female    DOB: [birth year]  Age: 33 y.o. MRN: [id number]  CC: ER FOLLOW UP (Migraine)   HPI CASARA PERRIER presents for frontal HA, consant. Throbbing. Increasing recently. Two a week. Imitrex makes her jittery. Went to E.D. last night had cocktail of toradol  15, reglan 10 and decadron  10 mg. 8-9/10 currently.  Unfortunately her relief was just for a very few hours.  She has not ever taken a migraine prevention drug.  Imitrex is the only abortive drug other than over-the-counter pain relievers.  Over time as her symptoms have progressed these have become less and less effective as her migraines have become more more frequent and severe.     10/09/2023    4:07 PM 08/13/2023    8:48 AM 05/13/2023    3:07 PM  Depression screen PHQ 2/9  Decreased Interest 0 0 0  Down, Depressed, Hopeless 0 0 0  PHQ - 2 Score 0 0 0  Altered sleeping   1  Tired, decreased energy   1  Change in appetite   0  Feeling bad or failure about yourself    0  Trouble concentrating   0  Moving slowly or fidgety/restless   0  Suicidal thoughts   0  PHQ-9 Score   2    History Tynisha has a past medical history of Diabetes mellitus without complication (HCC) and Migraine.   She has a past surgical history that includes No past surgeries and birthmark removed as a child.   Her family history includes Diabetes in her father; Heart attack in her paternal grandfather; Stroke in her maternal grandmother; Suicidality in her maternal grandfather.She reports that she has never smoked. She has never used smokeless tobacco. She reports that she does not currently use alcohol. She reports that she does not use drugs.    ROS Review of Systems  Constitutional: Negative.   HENT: Negative.    Eyes:  Positive for photophobia. Negative for visual disturbance.  Respiratory:  Negative for shortness of breath.   Cardiovascular:  Negative for chest pain.  Gastrointestinal:  Negative for  abdominal pain.  Musculoskeletal:  Negative for arthralgias.  Neurological:  Positive for headaches. Negative for dizziness, syncope, facial asymmetry and light-headedness.    Objective:  BP 130/83   Pulse (!) 102   Temp 97.6 F (36.4 C)   Ht 5' 5 (1.651 m)   Wt 168 lb 3.2 oz (76.3 kg)   SpO2 97%   BMI 27.99 kg/m   BP Readings from Last 3 Encounters:  10/09/23 130/83  08/13/23 91/61  05/13/23 97/62    Wt Readings from Last 3 Encounters:  10/09/23 168 lb 3.2 oz (76.3 kg)  08/13/23 165 lb (74.8 kg)  05/13/23 164 lb (74.4 kg)     Physical Exam Constitutional:      General: She is not in acute distress.    Appearance: She is well-developed.  Cardiovascular:     Rate and Rhythm: Normal rate and regular rhythm.  Pulmonary:     Breath sounds: Normal breath sounds.  Musculoskeletal:        General: Normal range of motion.  Skin:    General: Skin is warm and dry.  Neurological:     Mental Status: She is alert and oriented to person, place, and time.      Assessment & Plan:  Intractable chronic migraine with aura with status migrainosus  Other orders -     Rizatriptan   Benzoate; Take 1 tablet (10 mg total) by mouth as needed for migraine. May repeat in 2 hours if needed  Dispense: 10 tablet; Refill: 0 -     Topiramate ; 1 nightly times 1 week.  Then 2 nightly for 1 week.  Then 3 nightly for 1 week.  Then 4 nightly.  Dispense: 120 tablet; Refill: 0     Follow-up: Return in about 1 month (around 11/09/2023).  Butler Der, M.D.

## 2023-10-12 ENCOUNTER — Encounter: Payer: Self-pay | Admitting: Family Medicine

## 2023-10-21 ENCOUNTER — Encounter: Payer: Self-pay | Admitting: General Surgery

## 2023-10-21 ENCOUNTER — Ambulatory Visit: Admitting: General Surgery

## 2023-10-21 VITALS — BP 109/77 | HR 80 | Temp 98.3°F | Resp 14 | Ht 65.0 in | Wt 167.0 lb

## 2023-10-21 DIAGNOSIS — K429 Umbilical hernia without obstruction or gangrene: Secondary | ICD-10-CM | POA: Diagnosis not present

## 2023-10-21 NOTE — Progress Notes (Signed)
 [facility] History and Physical  Reason for Referral: Umbilical hernia  Referring Physician:  Cathlene Marry Lenis, FNP (Inactive)   Chief Complaint   New Patient (Initial Visit)     Vickie Lloyd is a 33 y.o. female.  HPI:   Discussed the use of AI scribe software for clinical note transcription with the patient, who gave verbal consent to proceed.  History of Present Illness Vickie Lloyd is a 33 year old female who presents with a symptomatic umbilical hernia. She was referred for evaluation of a belly button hernia found on ultrasound.  She has a symptomatic umbilical hernia measuring about one centimeter wide, as identified on an ultrasound. The hernia contains a small amount of fat and causes discomfort and irritation, particularly when turning certain ways or after eating. It becomes more prominent when she bears down or after eating.  She has a history of three pregnancies and vaginal births. She underwent liposuction approximately a year and a half ago and noticed the hernia after the procedure.   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 She is not planning to have more children.      Past Medical History:  Diagnosis Date   Diabetes mellitus without complication (HCC)    Migraine     Past Surgical History:  Procedure Laterality Date   birthmark removed as a child     NO PAST SURGERIES      Family History  Problem Relation Age of Onset   Heart attack Paternal Grandfather    Stroke Maternal Grandmother    Suicidality Maternal Grandfather    Diabetes Father     Social History   Tobacco Use   Smoking status: Never   Smokeless tobacco: Never  Vaping Use   Vaping status: Never Used  Substance Use Topics   Alcohol use: Not Currently    Comment: rarely   Drug use: No    Medications: I have reviewed the patient's current medications. Allergies as of 10/21/2023   No Known Allergies       Medication List        Accurate as of October 21, 2023  9:47 AM. If you have any questions, ask your nurse or doctor.          levonorgestrel  20 MCG/DAY Iud Commonly known as: MIRENA  1 each by Intrauterine route once.   rizatriptan  10 MG disintegrating tablet Commonly known as: MAXALT -MLT Take 1 tablet (10 mg total) by mouth as needed for migraine. May repeat in 2 hours if needed   topiramate  25 MG tablet Commonly known as: TOPAMAX  1 nightly times 1 week.  Then 2 nightly for 1 week.  Then 3 nightly for 1 week.  Then 4 nightly.         ROS:  A comprehensive review of systems was negative except for: Gastrointestinal: positive for abdominal pain and hernia  Blood pressure 109/77, pulse 80, temperature 98.3 F (36.8 C), temperature source Oral, resp. rate 14, height 5' 5 (1.651 m), weight 167 lb (75.8 kg), SpO2 97%. Physical Exam Physical Exam GENERAL: Alert, cooperative, well developed, no acute distress. HEENT: Normocephalic, normal oropharynx, moist mucous membranes. CHEST: Clear to auscultation bilaterally, no wheezes, rhonchi, or crackles. CARDIOVASCULAR: Normal heart rate and rhythm ABDOMEN: Soft, non-tender, non-distended, without organomegaly, normal bowel sounds. Small umbilical hernia present. <1cm, reducible, no major diastasis, Good abdominal muscle tone. EXTREMITIES: No cyanosis or edema. NEUROLOGICAL: Cranial nerves grossly intact, moves all extremities  without gross motor or sensory deficit.  Results:  CLINICAL DATA:  Supraumbilical hernia.  Palpable lump.   EXAM: ULTRASOUND ABDOMEN LIMITED   COMPARISON:  None Available.   FINDINGS: Targeted sonographic images of the anterior abdominal wall superior to the umbilicus corresponding to the area of palpable lump performed.   There is a fat containing supraumbilical hernia. The neck of the hernia defect measures approximately 1 cm in transverse axial diameter. No bowel herniation or fluid  collection.   IMPRESSION: Fat containing supraumbilical hernia.     Electronically Signed   By: Vanetta Chou M.D.   On: 08/13/2023 10:59   Assessment & Plan Umbilical hernia without obstruction or gangrene 1 cm umbilical hernia with fat, no future pregnancies planned. Good muscle tone and fascia. Discussed recurrence risks, surgical procedure, and post-op care. Mesh considered if hernia larger or recurs. With this small 1cm defect, I think primary repair probably the most likely but mesh possible if larger when I get to surgery.  - Advise no lifting over 10 pounds for 6-8 weeks post-surgery. - Provide work notes or FMLA documentation as needed. - Discussed risk of bleeding, infection, recurrence.    All questions were answered to the satisfaction of the patient.    Vickie Lloyd Pander 10/21/2023, 9:47 AM

## 2023-10-21 NOTE — H&P (Signed)
 [facility] History and Physical  Reason for Referral: Umbilical hernia  Referring Physician:  Cathlene Marry Lenis, FNP (Inactive)   Chief Complaint   New Patient (Initial Visit)     Vickie Lloyd is a 33 y.o. female.  HPI:   Discussed the use of AI scribe software for clinical note transcription with the patient, who gave verbal consent to proceed.  History of Present Illness Vickie Lloyd is a 33 year old female who presents with a symptomatic umbilical hernia. She was referred for evaluation of a belly button hernia found on ultrasound.  She has a symptomatic umbilical hernia measuring about one centimeter wide, as identified on an ultrasound. The hernia contains a small amount of fat and causes discomfort and irritation, particularly when turning certain ways or after eating. It becomes more prominent when she bears down or after eating.  She has a history of three pregnancies and vaginal births. She underwent liposuction approximately a year and a half ago and noticed the hernia after the procedure.   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 She is not planning to have more children.      Past Medical History:  Diagnosis Date   Diabetes mellitus without complication (HCC)    Migraine     Past Surgical History:  Procedure Laterality Date   birthmark removed as a child     NO PAST SURGERIES      Family History  Problem Relation Age of Onset   Heart attack Paternal Grandfather    Stroke Maternal Grandmother    Suicidality Maternal Grandfather    Diabetes Father     Social History   Tobacco Use   Smoking status: Never   Smokeless tobacco: Never  Vaping Use   Vaping status: Never Used  Substance Use Topics   Alcohol use: Not Currently    Comment: rarely   Drug use: No    Medications: I have reviewed the patient's current medications. Allergies as of 10/21/2023   No Known Allergies       Medication List        Accurate as of October 21, 2023  9:47 AM. If you have any questions, ask your nurse or doctor.          levonorgestrel  20 MCG/DAY Iud Commonly known as: MIRENA  1 each by Intrauterine route once.   rizatriptan  10 MG disintegrating tablet Commonly known as: MAXALT -MLT Take 1 tablet (10 mg total) by mouth as needed for migraine. May repeat in 2 hours if needed   topiramate  25 MG tablet Commonly known as: TOPAMAX  1 nightly times 1 week.  Then 2 nightly for 1 week.  Then 3 nightly for 1 week.  Then 4 nightly.         ROS:  A comprehensive review of systems was negative except for: Gastrointestinal: positive for abdominal pain and hernia  Blood pressure 109/77, pulse 80, temperature 98.3 F (36.8 C), temperature source Oral, resp. rate 14, height 5' 5 (1.651 m), weight 167 lb (75.8 kg), SpO2 97%. Physical Exam Physical Exam GENERAL: Alert, cooperative, well developed, no acute distress. HEENT: Normocephalic, normal oropharynx, moist mucous membranes. CHEST: Clear to auscultation bilaterally, no wheezes, rhonchi, or crackles. CARDIOVASCULAR: Normal heart rate and rhythm ABDOMEN: Soft, non-tender, non-distended, without organomegaly, normal bowel sounds. Small umbilical hernia present. <1cm, reducible, no major diastasis, Good abdominal muscle tone. EXTREMITIES: No cyanosis or edema. NEUROLOGICAL: Cranial nerves grossly intact, moves all extremities  without gross motor or sensory deficit.  Results:  CLINICAL DATA:  Supraumbilical hernia.  Palpable lump.   EXAM: ULTRASOUND ABDOMEN LIMITED   COMPARISON:  None Available.   FINDINGS: Targeted sonographic images of the anterior abdominal wall superior to the umbilicus corresponding to the area of palpable lump performed.   There is a fat containing supraumbilical hernia. The neck of the hernia defect measures approximately 1 cm in transverse axial diameter. No bowel herniation or fluid  collection.   IMPRESSION: Fat containing supraumbilical hernia.     Electronically Signed   By: Vanetta Chou M.D.   On: 08/13/2023 10:59   Assessment & Plan Umbilical hernia without obstruction or gangrene 1 cm umbilical hernia with fat, no future pregnancies planned. Good muscle tone and fascia. Discussed recurrence risks, surgical procedure, and post-op care. Mesh considered if hernia larger or recurs. With this small 1cm defect, I think primary repair probably the most likely but mesh possible if larger when I get to surgery.  - Advise no lifting over 10 pounds for 6-8 weeks post-surgery. - Provide work notes or FMLA documentation as needed. - Discussed risk of bleeding, infection, recurrence.    All questions were answered to the satisfaction of the patient.    Manuelita JAYSON Pander 10/21/2023, 9:47 AM

## 2023-10-21 NOTE — Patient Instructions (Signed)
 Umbilical hernia repair 1cm- likely will just use permanent sutures, may have to use small piece of mesh if larger than expected

## 2023-10-28 NOTE — Patient Instructions (Signed)
 Vickie Lloyd  10/28/2023     @PREFPERIOPPHARMACY @   Your procedure is scheduled on  10/31/2023.   [facility] to [facility] at  0800 A.M.   Call this number if you have problems the morning of surgery:  [phone]  If you experience any cold or flu symptoms such as cough, fever, chills, shortness of breath, etc. between now and your scheduled surgery, please notify us  at the above number.   Remember:  Do not eat after midnight.   You may drink clear liquids until 0600 am on 10/31/2023.    Clear liquids allowed are:                    Water, Juice (No red color; non-citric and without pulp; diabetics please choose diet or no sugar options), Carbonated beverages (diabetics please choose diet or no sugar options), Clear Tea (No creamer, milk, or cream, including half & half and powdered creamer), Black Coffee Only (No creamer, milk or cream, including half & half and powdered creamer), and Clear Sports drink (No red color; diabetics please choose diet or no sugar options)    Take these medicines the morning of surgery with A SIP OF WATER                                  Rizatriptan  (if needed).    Do not wear jewelry, make-up or nail polish, including gel polish,  artificial nails, or any other type of covering on natural nails (fingers and  toes).  Do not wear lotions, powders, or perfumes, or deodorant.  Do not shave 48 hours prior to surgery.  Men may shave face and neck.  Do not bring valuables to the hospital.  [facility] is not responsible for any belongings or valuables.  Contacts, dentures or bridgework may not be worn into surgery.  Leave your suitcase in the car.  After surgery it may be brought to your room.  For patients admitted to the hospital, discharge time will be determined by your treatment team.  Patients discharged the day of surgery will not be allowed to drive home and must have someone with them for 24 hours.    Special instructions:   DO NOT  smoke tobacco or vape for 24 hours before your procedure.  Please read over the following fact sheets that you were given. Pain Booklet, Surgical Site Infection Prevention, Anesthesia Post-op Instructions, and Care and Recovery After Surgery        Open Hernia Repair, Adult Open hernia repair is surgery to fix a hernia. A hernia happens when part of your body pushes out through a weak spot in the muscles of your belly. This makes a bulge. You may need surgery if the bulge can't be pushed back into place and: The contents of your belly are stuck in the opening. The blood supply is cut off. The hernia gets bigger and causes more discomfort. Tell a health care provider about: Any allergies you have. All medicines you take. These include vitamins, herbs, eye drops, and creams you use. Any problems you or family members have had with anesthesia. Any medical conditions you have. These include blood and bone problems. Any surgeries you've had. Any recent cold or flu symptoms. Whether you're pregnant or may be pregnant. What are the risks? Your health care provider will talk with you about risks. These may include:  Bleeding. Infection. Damage to nearby structures or organs. These include the bladder, blood vessels, intestines, nerves, or testicles. Blood clots in your legs or lungs. Trouble peeing. The hernia coming back. What happens before the surgery? When to stop eating and drinking Follow instructions about what you may eat and drink. These may include: 8 hours before your surgery Stop eating most foods. Do not eat meat, fried foods, or fatty foods. Eat only light foods, such as toast or crackers. All liquids are okay except energy drinks and alcohol. 6 hours before your surgery Stop eating. Drink only clear liquids, such as water, clear fruit juice, black coffee, plain tea, and sports drinks. Do not drink energy drinks or alcohol. 2 hours before your surgery Stop drinking all  liquids. You may be allowed to take medicines with small sips of water. If you don't follow your provider's instructions, your surgery may be delayed or canceled. Medicines Ask about changing or stopping: Any medicines you take. Any vitamins, herbs, or supplements you take. Do not take aspirin  or ibuprofen  unless you're told to. Surgery safety For your safety, your provider may: Remove hair at the surgery site. Ask you to wash your skin with a soap that kills germs. Give you antibiotics. Tests You may have exams or tests. These may include: Blood tests. Imaging tests. General instructions Do not smoke, vape, or use any products that have nicotine or tobacco for at least 4 weeks before the surgery. If you need help quitting, talk with your provider. Ask if you'll be staying overnight in the hospital. If you'll be going home right after the surgery, plan to have a responsible adult: Drive you home from the hospital or clinic. You won't be allowed to drive. Stay with you for the time you're told. What happens during the surgery? An IV will be put into a vein in your hand or arm. You will be given: A sedative. This helps you relax. Anesthesia. This keeps you from feeling pain. It will make you fall asleep for surgery. A cut will be made over the hernia. The bulge will be put back into place. The edges of the hernia may be stitched together. The opening in the belly muscles will be closed with stitches. In some cases, a mesh patch may be put over the opening instead. The cut from surgery will be closed with stitches, skin glue, or tape strips. A bandage may be put over it. The surgery may vary among providers and hospitals. What happens after the surgery? You'll be watched closely until you leave. This includes checking your blood pressure, heart rate, breathing rate, and blood oxygen level. You may be given medicine to help with pain. This information is not intended to replace  advice given to you by your health care provider. Make sure you discuss any questions you have with your health care provider. Document Revised: 11/21/2022 Document Reviewed: 05/31/2022 Elsevier Patient Education  2024 Else[employer] Inc.General Anesthesia, Adult, Care After The following information offers guidance on how to care for yourself after your procedure. Your health care provider may also give you more specific instructions. If you have problems or questions, contact your health care provider. What can I expect after the procedure? After the procedure, it is common for people to: Have pain or discomfort at the IV site. Have nausea or vomiting. Have a sore throat or hoarseness. Have trouble concentrating. Feel cold or chills. Feel weak, sleepy, or tired (fatigue). Have soreness and body aches. These can affect parts  of the body that were not involved in surgery. Follow these instructions at home: For the time period you were told by your health care provider:  Rest. Do not participate in activities where you could fall or become injured. Do not drive or use machinery. Do not drink alcohol. Do not take sleeping pills or medicines that cause drowsiness. Do not make important decisions or sign legal documents. Do not take care of children on your own. General instructions Drink enough fluid to keep your urine pale yellow. If you have sleep apnea, surgery and certain medicines can increase your risk for breathing problems. Follow instructions from your health care provider about wearing your sleep device: Anytime you are sleeping, including during daytime naps. While taking prescription pain medicines, sleeping medicines, or medicines that make you drowsy. Return to your normal activities as told by your health care provider. Ask your health care provider what activities are safe for you. Take over-the-counter and prescription medicines only as told by your health care provider. Do not  use any products that contain nicotine or tobacco. These products include cigarettes, chewing tobacco, and vaping devices, such as e-cigarettes. These can delay incision healing after surgery. If you need help quitting, ask your health care provider. Contact a health care provider if: You have nausea or vomiting that does not get better with medicine. You vomit every time you eat or drink. You have pain that does not get better with medicine. You cannot urinate or have bloody urine. You develop a skin rash. You have a fever. Get help right away if: You have trouble breathing. You have chest pain. You vomit blood. These symptoms may be an emergency. Get help right away. Call 911. Do not wait to see if the symptoms will go away. Do not drive yourself to the hospital. Summary After the procedure, it is common to have a sore throat, hoarseness, nausea, vomiting, or to feel weak, sleepy, or fatigue. For the time period you were told by your health care provider, do not drive or use machinery. Get help right away if you have difficulty breathing, have chest pain, or vomit blood. These symptoms may be an emergency. This information is not intended to replace advice given to you by your health care provider. Make sure you discuss any questions you have with your health care provider. Document Revised: 05/18/2021 Document Reviewed: 05/18/2021 Elsevier Patient Education  2024 Elsevier Inc.How to Use Chlorhexidine  at Home in the Shower Chlorhexidine  gluconate (CHG) is a germ-killing (antiseptic) wash that's used to clean the skin. It can get rid of the germs that normally live on the skin and can keep them away for about 24 hours. If you're having surgery, you may be told to shower with CHG at home the night before surgery. This can help lower your risk for infection. To use CHG wash in the shower, follow the steps below. Supplies needed: CHG body wash. Clean washcloth. Clean towel. How to use CHG  in the shower Follow these steps unless you're told to use CHG in a different way: Start the shower. Use your normal soap and shampoo to wash your face and hair. Turn off the shower or move out of the shower stream. Pour CHG onto a clean washcloth. Do not use any type of brush or rough sponge. Start at your neck, washing your body down to your toes. Make sure you: Wash the part of your body where the surgery will be done for at least 1 minute. Do  not scrub. Do not use CHG on your head or face unless your health care provider tells you to. If it gets into your ears or eyes, rinse them well with water. Do not wash your genitals with CHG. Wash your back and under your arms. Make sure to wash skin folds. Let the CHG sit on your skin for 1-2 minutes or as long as told. Rinse your entire body in the shower, including all body creases and folds. Turn off the shower. Dry off with a clean towel. Do not put anything on your skin afterward, such as powder, lotion, or perfume. Put on clean clothes or pajamas. If it's the night before surgery, sleep in clean sheets. General tips Use CHG only as told, and follow the instructions on the label. Use the full amount of CHG as told. This is often one bottle. Do not smoke and stay away from flames after using CHG. Your skin may feel sticky after using CHG. This is normal. The sticky feeling will go away as the CHG dries. Do not use CHG: If you have a chlorhexidine  allergy or have reacted to chlorhexidine  in the past. On open wounds or areas of skin that have broken skin, cuts, or scrapes. On babies younger than 18 months of age. Contact a health care provider if: You have questions about using CHG. Your skin gets irritated or itchy. You have a rash after using CHG. You swallow any CHG. Call your local poison control center [phone] in the [place] Your eyes itch badly, or they become very red or swollen. Your hearing changes. You have trouble  seeing. If you can't reach your provider, go to an urgent care or emergency room. Do not drive yourself. Get help right away if: You have swelling or tingling in your mouth or throat. You make high-pitched whistling sounds when you breathe, most often when you breathe out (wheeze). You have trouble breathing. These symptoms may be an emergency. Call 911 right away. Do not wait to see if the symptoms will go away. Do not drive yourself to the hospital. This information is not intended to replace advice given to you by your health care provider. Make sure you discuss any questions you have with your health care provider. Document Revised: 09/03/2022 Document Reviewed: 08/30/2021 Elsevier Patient Education  2024 [employer].

## 2023-10-29 ENCOUNTER — Encounter (HOSPITAL_COMMUNITY)
Admission: RE | Admit: 2023-10-29 | Discharge: 2023-10-29 | Disposition: A | Discharge status: Home / Self Care | Source: Ambulatory Visit | Attending: General Surgery | Admitting: General Surgery

## 2023-10-29 ENCOUNTER — Encounter (HOSPITAL_COMMUNITY): Payer: Self-pay

## 2023-10-29 ENCOUNTER — Other Ambulatory Visit: Payer: Self-pay

## 2023-10-29 VITALS — BP 103/70 | HR 80 | Temp 98.1°F | Resp 16 | Ht 65.0 in | Wt 167.0 lb

## 2023-10-29 DIAGNOSIS — Z01818 Encounter for other preprocedural examination: Secondary | ICD-10-CM

## 2023-10-29 DIAGNOSIS — Z01812 Encounter for preprocedural laboratory examination: Secondary | ICD-10-CM | POA: Insufficient documentation

## 2023-10-29 HISTORY — DX: Other specified postprocedural states: Z98.890

## 2023-10-29 HISTORY — DX: Other specified postprocedural states: R11.2

## 2023-10-29 HISTORY — DX: Gestational diabetes mellitus in pregnancy, unspecified control: O24.419

## 2023-10-29 HISTORY — DX: Gastro-esophageal reflux disease without esophagitis: K21.9

## 2023-10-29 LAB — POCT PREGNANCY, URINE: Preg Test, Ur: NEGATIVE

## 2023-10-31 ENCOUNTER — Other Ambulatory Visit: Payer: Self-pay

## 2023-10-31 ENCOUNTER — Ambulatory Visit (HOSPITAL_COMMUNITY): Payer: Self-pay | Admitting: Anesthesiology

## 2023-10-31 ENCOUNTER — Ambulatory Visit (HOSPITAL_COMMUNITY)
Admission: RE | Admit: 2023-10-31 | Discharge: 2023-10-31 | Disposition: A | Discharge status: Home / Self Care | Attending: General Surgery | Admitting: General Surgery

## 2023-10-31 ENCOUNTER — Encounter (HOSPITAL_COMMUNITY)
Admission: RE | Disposition: A | Discharge status: Home / Self Care | Payer: Self-pay | Source: Home / Self Care | Attending: General Surgery

## 2023-10-31 ENCOUNTER — Ambulatory Visit (HOSPITAL_BASED_OUTPATIENT_CLINIC_OR_DEPARTMENT_OTHER): Payer: Self-pay | Admitting: Anesthesiology

## 2023-10-31 ENCOUNTER — Encounter (HOSPITAL_COMMUNITY): Payer: Self-pay | Admitting: General Surgery

## 2023-10-31 DIAGNOSIS — K429 Umbilical hernia without obstruction or gangrene: Secondary | ICD-10-CM

## 2023-10-31 DIAGNOSIS — E119 Type 2 diabetes mellitus without complications: Secondary | ICD-10-CM | POA: Insufficient documentation

## 2023-10-31 HISTORY — PX: UMBILICAL HERNIA REPAIR: SHX196

## 2023-10-31 SURGERY — REPAIR, HERNIA, UMBILICAL, ADULT
Anesthesia: General | Site: Abdomen

## 2023-10-31 MED ORDER — LIDOCAINE 2% (20 MG/ML) 5 ML SYRINGE
INTRAMUSCULAR | Status: DC | PRN
Start: 2023-10-31 — End: 2023-10-31
  Administered 2023-10-31: 60 mg via INTRAVENOUS

## 2023-10-31 MED ORDER — ONDANSETRON HCL 4 MG/2ML IJ SOLN: 4.0000 mg | Freq: Once | INTRAMUSCULAR | Status: DC | PRN

## 2023-10-31 MED ORDER — OXYCODONE HCL 5 MG PO TABS: 5.0000 mg | ORAL_TABLET | ORAL | 0 refills | Status: DC | PRN

## 2023-10-31 MED ORDER — SUGAMMADEX SODIUM 200 MG/2ML IV SOLN
INTRAVENOUS | Status: DC | PRN
  Administered 2023-10-31: 200 mg via INTRAVENOUS

## 2023-10-31 MED ORDER — FENTANYL CITRATE PF 50 MCG/ML IJ SOSY
25.0000 ug | PREFILLED_SYRINGE | INTRAMUSCULAR | Status: DC | PRN
  Administered 2023-10-31: 50 ug via INTRAVENOUS

## 2023-10-31 MED ORDER — CEFAZOLIN SODIUM-DEXTROSE 2-4 GM/100ML-% IV SOLN
2.0000 g | INTRAVENOUS | Status: AC
  Administered 2023-10-31: 2 g via INTRAVENOUS
  Filled 2023-10-31: qty 100

## 2023-10-31 MED ORDER — MIDAZOLAM HCL 2 MG/2ML IJ SOLN
INTRAMUSCULAR | Status: AC
  Filled 2023-10-31: qty 2

## 2023-10-31 MED ORDER — SODIUM CHLORIDE 0.9 % IR SOLN
Status: DC | PRN
  Administered 2023-10-31: 1000 mL

## 2023-10-31 MED ORDER — ONDANSETRON HCL 4 MG PO TABS: 4.0000 mg | ORAL_TABLET | Freq: Three times a day (TID) | ORAL | 1 refills | Status: DC | PRN

## 2023-10-31 MED ORDER — KETOROLAC TROMETHAMINE 30 MG/ML IJ SOLN
INTRAMUSCULAR | Status: DC | PRN
  Administered 2023-10-31: 30 mg via INTRAVENOUS

## 2023-10-31 MED ORDER — ROCURONIUM BROMIDE 100 MG/10ML IV SOLN
INTRAVENOUS | Status: DC | PRN
  Administered 2023-10-31: 50 mg via INTRAVENOUS

## 2023-10-31 MED ORDER — FENTANYL CITRATE (PF) 100 MCG/2ML IJ SOLN
INTRAMUSCULAR | Status: DC | PRN
  Administered 2023-10-31: 100 ug via INTRAVENOUS

## 2023-10-31 MED ORDER — OXYCODONE HCL 5 MG/5ML PO SOLN: 5.0000 mg | Freq: Once | ORAL | Status: DC | PRN

## 2023-10-31 MED ORDER — FENTANYL CITRATE PF 50 MCG/ML IJ SOSY
PREFILLED_SYRINGE | INTRAMUSCULAR | Status: AC
  Filled 2023-10-31: qty 1

## 2023-10-31 MED ORDER — DEXAMETHASONE SODIUM PHOSPHATE 10 MG/ML IJ SOLN
INTRAMUSCULAR | Status: DC | PRN
  Administered 2023-10-31: 8 mg via INTRAVENOUS

## 2023-10-31 MED ORDER — CHLORHEXIDINE GLUCONATE CLOTH 2 % EX PADS: 6.0000 | MEDICATED_PAD | Freq: Once | CUTANEOUS | Status: DC

## 2023-10-31 MED ORDER — BUPIVACAINE HCL (PF) 0.5 % IJ SOLN
INTRAMUSCULAR | Status: DC | PRN
Start: 2023-10-31 — End: 2023-10-31
  Administered 2023-10-31: 20 mL

## 2023-10-31 MED ORDER — PROPOFOL 10 MG/ML IV BOLUS
INTRAVENOUS | Status: DC | PRN
  Administered 2023-10-31: 160 mg via INTRAVENOUS

## 2023-10-31 MED ORDER — LACTATED RINGERS IV SOLN: INTRAVENOUS | Status: DC | PRN

## 2023-10-31 MED ORDER — MIDAZOLAM HCL 2 MG/2ML IJ SOLN
INTRAMUSCULAR | Status: DC | PRN
  Administered 2023-10-31: 2 mg via INTRAVENOUS

## 2023-10-31 MED ORDER — FENTANYL CITRATE (PF) 100 MCG/2ML IJ SOLN
INTRAMUSCULAR | Status: AC
Start: 2023-10-31 — End: 2023-10-31
  Filled 2023-10-31: qty 2

## 2023-10-31 MED ORDER — OXYCODONE HCL 5 MG PO TABS: 5.0000 mg | ORAL_TABLET | Freq: Once | ORAL | Status: DC | PRN

## 2023-10-31 MED ORDER — ONDANSETRON HCL 4 MG/2ML IJ SOLN
INTRAMUSCULAR | Status: DC | PRN
  Administered 2023-10-31: 4 mg via INTRAVENOUS

## 2023-10-31 MED ORDER — BUPIVACAINE HCL (PF) 0.5 % IJ SOLN
INTRAMUSCULAR | Status: AC
  Filled 2023-10-31: qty 30

## 2023-10-31 MED ORDER — PROPOFOL 10 MG/ML IV BOLUS
INTRAVENOUS | Status: AC
  Filled 2023-10-31: qty 20

## 2023-10-31 SURGICAL SUPPLY — 28 items
BLADE SURG 15 STRL LF DISP TIS (BLADE) ×1 IMPLANT
CHLORAPREP W/TINT 26 (MISCELLANEOUS) ×1 IMPLANT
CLOTH BEACON ORANGE TIMEOUT ST (SAFETY) ×1 IMPLANT
COVER LIGHT HANDLE STERIS (MISCELLANEOUS) ×2 IMPLANT
DERMABOND ADVANCED .7 DNX12 (GAUZE/BANDAGES/DRESSINGS) ×1 IMPLANT
DRSG TEGADERM 2-3/8X2-3/4 SM (GAUZE/BANDAGES/DRESSINGS) IMPLANT
ELECTRODE REM PT RTRN 9FT ADLT (ELECTROSURGICAL) ×1 IMPLANT
GAUZE SPONGE 2X2 STRL 8-PLY (GAUZE/BANDAGES/DRESSINGS) IMPLANT
GLOVE BIO SURGEON STRL SZ 6.5 (GLOVE) ×1 IMPLANT
GLOVE BIOGEL PI IND STRL 6.5 (GLOVE) ×1 IMPLANT
GLOVE BIOGEL PI IND STRL 7.0 (GLOVE) ×2 IMPLANT
GOWN STRL REUS W/TWL LRG LVL3 (GOWN DISPOSABLE) ×2 IMPLANT
KIT TURNOVER KIT A (KITS) ×1 IMPLANT
MANIFOLD NEPTUNE II (INSTRUMENTS) ×1 IMPLANT
NDL HYPO 18GX1.5 BLUNT FILL (NEEDLE) ×1 IMPLANT
NDL HYPO 21X1.5 SAFETY (NEEDLE) ×1 IMPLANT
NEEDLE HYPO 18GX1.5 BLUNT FILL (NEEDLE) ×1 IMPLANT
NEEDLE HYPO 21X1.5 SAFETY (NEEDLE) ×1 IMPLANT
NS IRRIG 1000ML POUR BTL (IV SOLUTION) ×1 IMPLANT
PACK MINOR (CUSTOM PROCEDURE TRAY) ×1 IMPLANT
PAD ARMBOARD POSITIONER FOAM (MISCELLANEOUS) ×1 IMPLANT
POSITIONER HEAD 8X9X4 ADT (SOFTGOODS) ×1 IMPLANT
SET BASIN LINEN APH (SET/KITS/TRAYS/PACK) ×1 IMPLANT
SUT ETHIBOND NAB MO 7 #0 18IN (SUTURE) ×1 IMPLANT
SUT MNCRL AB 4-0 PS2 18 (SUTURE) ×1 IMPLANT
SUT VIC AB 2-0 CT2 27 (SUTURE) IMPLANT
SUT VIC AB 3-0 SH 27X BRD (SUTURE) ×1 IMPLANT
SYR 30ML LL (SYRINGE) ×2 IMPLANT

## 2023-10-31 NOTE — Anesthesia Postprocedure Evaluation (Signed)
 Anesthesia Post Note  Patient: Vickie Lloyd  Procedure(s) Performed: PRIMARY REPAIR, HERNIA, UMBILICAL, ADULT (Abdomen)  Patient location during evaluation: Phase II Anesthesia Type: General Level of consciousness: awake Pain management: pain level controlled Vital Signs Assessment: post-procedure vital signs reviewed and stable Respiratory status: spontaneous breathing and respiratory function stable Cardiovascular status: blood pressure returned to baseline and stable Postop Assessment: no headache and no apparent nausea or vomiting Anesthetic complications: no Comments: Late entry   No notable events documented.   Last Vitals:  Vitals:   10/31/23 1015 10/31/23 1030  BP: 103/71 111/83  Pulse: 82 78  Resp: 16 17  Temp:  36.6 C  SpO2: 100% 100%    Last Pain:  Vitals:   10/31/23 1030  TempSrc: Oral  PainSc: 2                  Yvonna JINNY Bosworth

## 2023-10-31 NOTE — Progress Notes (Signed)
 [facility]  Updated her mom on surgery. Will see in 4 weeks.   Manuelita Pander, MD [facility] [address] [place], [place] [address] [phone] (office)

## 2023-10-31 NOTE — Anesthesia Preprocedure Evaluation (Signed)
 Anesthesia Evaluation  Patient identified by MRN, date of birth, ID band Patient awake    Reviewed: Allergy & Precautions, H&P , NPO status , Patient's Chart, lab work & pertinent test results, reviewed documented beta blocker date and time   History of Anesthesia Complications (+) PONV and history of anesthetic complications  Airway Mallampati: II  TM Distance: >3 FB Neck ROM: full    Dental no notable dental hx.    Pulmonary neg pulmonary ROS   Pulmonary exam normal breath sounds clear to auscultation       Cardiovascular Exercise Tolerance: Good hypertension, negative cardio ROS  Rhythm:regular Rate:Normal     Neuro/Psych  Headaches PSYCHIATRIC DISORDERS Anxiety Depression       GI/Hepatic Neg liver ROS,GERD  ,,  Endo/Other  diabetes    Renal/GU negative Renal ROS  negative genitourinary   Musculoskeletal   Abdominal   Peds  Hematology negative hematology ROS (+)   Anesthesia Other Findings   Reproductive/Obstetrics negative OB ROS                              Anesthesia Physical Anesthesia Plan  ASA: 2  Anesthesia Plan: General and General ETT   Post-op Pain Management:    Induction:   PONV Risk Score and Plan: Ondansetron   Airway Management Planned:   Additional Equipment:   Intra-op Plan:   Post-operative Plan:   Informed Consent: I have reviewed the patients History and Physical, chart, labs and discussed the procedure including the risks, benefits and alternatives for the proposed anesthesia with the patient or authorized representative who has indicated his/her understanding and acceptance.     Dental Advisory Given  Plan Discussed with: CRNA  Anesthesia Plan Comments:         Anesthesia Quick Evaluation

## 2023-10-31 NOTE — Interval H&P Note (Signed)
 History and Physical Interval Note:  10/31/2023 7:57 AM  Vickie Lloyd  has presented today for surgery, with the diagnosis of HERNIA, UMBILICAL 1 CM.  The various methods of treatment have been discussed with the patient and family. After consideration of risks, benefits and other options for treatment, the patient has consented to  Procedure(s) with comments: REPAIR, HERNIA, UMBILICAL, ADULT (N/A) - W/ POSSIBLE MESH as a surgical intervention.  The patient's history has been reviewed, patient examined, no change in status, stable for surgery.  I have reviewed the patient's chart and labs.  Questions were answered to the patient's satisfaction.     Manuelita JAYSON Pander

## 2023-10-31 NOTE — Transfer of Care (Signed)
 Immediate Anesthesia Transfer of Care Note  Patient: Vickie Lloyd  Procedure(s) Performed: PRIMARY REPAIR, HERNIA, UMBILICAL, ADULT (Abdomen)  Patient Location: PACU  Anesthesia Type:General  Level of Consciousness: awake, alert , oriented, and patient cooperative  Airway & Oxygen Therapy: Patient Spontanous Breathing  Post-op Assessment: Report given to RN, Post -op Vital signs reviewed and stable, and Patient moving all extremities X 4  Post vital signs: Reviewed and stable  Last Vitals:  Vitals Value Taken Time  BP 119/71 10/31/23 09:35  Temp 98 0935  Pulse 93 10/31/23 09:38  Resp 14 10/31/23 09:38  SpO2 95 % 10/31/23 09:38  Vitals shown include unfiled device data.  Last Pain:  Vitals:   10/31/23 0804  TempSrc: Oral  PainSc: 0-No pain      Patients Stated Pain Goal: 7 (10/31/23 0804)  Complications: No notable events documented.

## 2023-10-31 NOTE — Discharge Instructions (Addendum)
 Discharge Instructions Hernia:  Common Complaints: Pain at the incision site is common. This will improve with time. Take your pain medications as described below. Some bruising and swelling is common in the area.  Some nausea is common and poor appetite. The main goal is to stay hydrated the first few days after surgery.   Diet/ Activity: Diet as tolerated. You may not have an appetite, but it is important to stay hydrated. Drink 64 ounces of water a day. Your appetite will return with time.  Remove the small clear dressing and gauze after two days (48 hours). Trim the gauze off the glue that is underneath if the gauze is stuck to the glue. Until you remove the gauze do birdbaths/ showers that do not get the incision/ dressing wet.  After the 48 hours, shower per your regular routine daily.  Do not take hot showers as this can disrupt the glue. Take warm showers that are less than 10 minutes. Rest and listen to your body, but do not remain in bed all day.Walk everyday for at least 15-20 minutes.  Deep cough and move around every 1-2 hours in the first few days after surgery. Do not pick at the dermabond glue on your incision sites.  This glue film will remain in place for 1-2 weeks and will start to peel off. Do not place lotions or balms on your incision unless instructed to specifically by Dr. Kallie. Do not lift > 10 lbs, perform excessive bending, pushing, pulling, squatting for 6-8 weeks after surgery. Where your abdominal binder with activity as much as possible. The activity restrictions and the abdominal binder are to prevent hernia formation at your incision while you are healing.   Pain Expectations and Narcotics: -After surgery you will have pain associated with your incisions and this is normal. The pain is muscular and nerve pain, and will get better with time. -You are encouraged and expected to take non narcotic medications like tylenol  and ibuprofen  (when able) to treat pain  as multiple modalities can aid with pain treatment. -Narcotics are only used when pain is severe or there is breakthrough pain. -You are not expected to have a pain score of 0 after surgery, as we cannot prevent pain. A pain score of 3-4 that allows you to be functional, move, walk, and tolerate some activity is the goal. The pain will continue to improve over the days after surgery and is dependent on your surgery. -Due to [place] law, we are only able to give a certain amount of pain medication to treat post operative pain, and we only give additional narcotics on a patient by patient basis.  -For most laparoscopic surgery, studies have shown that the majority of patients only need 10-15 narcotic pills, and for open surgeries most patients only need 15-20.   -Having appropriate expectations of pain and knowledge of pain management with non narcotics is important as we do not want anyone to become addicted to narcotic pain medication.  -Using ice packs in the first 48 hours and heating pads after 48 hours, wearing an abdominal binder (when recommended), and using over the counter medications are all ways to help with pain management.   -Simple acts like meditation and mindfulness practices after surgery can also help with pain control and research has proven the benefit of these practices.  Medication: Take tylenol  and ibuprofen  as needed for pain control, alternating every 4-6 hours.  Example:  Tylenol  1000mg  @ 6am, 12noon, 6pm, (Do not exceed  4000mg  of tylenol  a day). Ibuprofen  800mg  @ 9am, 3pm, 9pm, 3am (Do not exceed 3600mg  of ibuprofen  a day).  Take Roxicodone  for breakthrough pain every 4 hours.  Take Colace for constipation related to narcotic pain medication. If you do not have a bowel movement in 2 days, take Miralax over the counter.  Drink plenty of water to also prevent constipation.   Contact Information: If you have questions or concerns, please call our office, [phone],  Monday- Thursday 8AM-5PM and Friday 8AM-12Noon.  If it is after hours or on the weekend, please call [facility], [phone], [phone], and ask to speak to the surgeon on call for Dr. Kallie at [facility].

## 2023-10-31 NOTE — Progress Notes (Signed)
 Called patient to ask if she could come earlier for her surgery on 10/31/23.  Phone went straight to voicemail.  Left detailed message requesting patient arrive at [phone], requested callback and left preop number on message.  Called her significant other Ron also, left a message asking him to have [facility] call us .

## 2023-10-31 NOTE — Op Note (Signed)
 [facility] Operative Note  10/31/23  Preoperative Diagnosis: Umbilical hernia    Postoperative Diagnosis: Same   Procedure(s) Performed: Primary repair umbilical hernia defect < 1cm    Surgeon: Manuelita BROCKS. Kallie, MD   Assistants: No qualified resident was available    Anesthesia: General endotracheal   Anesthesiologist: Dr. Kendell, MD   Specimens: None    Estimated Blood Loss: Minimal   Blood Replacement: None    Complications: None   Wound Class: Clean    Operative Indications: Ms. Bolte is a 33 yo who comes in with a small umbilical defect and some discomfort. We discussed repair and potential primary repair, mesh and risk of bleeding, infection, recurrence.   Findings: Small < 1cm supraumbilical defect    Procedure: The patient was taken to the operating room and placed supine. General endotracheal anesthesia was induced. Intravenous antibiotics were  administered per protocol.  The abdomen was prepared and draped in the usual sterile fashion.   The umbilical hernia was noted to be reducible and measured about <1cm just above the umbilicus in the supraumbilical location but very close to the umbilicus. An incision was made above the umbilicus, and carried down through the subcutaneous tissue with electrocautery.  Dissection was performed down to the level of the fascia, exposing the hernia sac. The umbilical stalk had to be amputated to get the inferior edge of fascia exposed..  The hernia sac was opened with care, and excess hernia sac was resected with electrocautery.  The adipose was excised from the hernia defect. The defect was small <1cm in size and did not need a mesh to be placed. The defect was closed with 0 Ethibond sutures.   The umbilicus was tacked to the fascia with a 2-0 Vicryl suture.   The cavity was closed with 3-0 Vicryl interrupted. Hemostasis was confirmed. The skin was closed with a running 4-0 Monocryl suture and dermabond.  After the  dermabond dried a 2X2 and tegaderm were placed over the umbilicus to act as a pressure dressing but not to disrupt the dermabond.    All counts were correct at the end of the case. The patient was awakened from anesthesia and extubated without complication.  The patient went to the PACU in stable condition.  Manuelita Kallie, MD [facility] [address] [place], [place] [address] [phone] (office)

## 2023-10-31 NOTE — Anesthesia Procedure Notes (Signed)
 Procedure Name: Intubation Date/Time: 10/31/2023 8:45 AM  Performed by: Cordella Elvie HERO, CRNAPre-anesthesia Checklist: Patient identified, Emergency Drugs available, Suction available, Patient being monitored and Timeout performed Patient Re-evaluated:Patient Re-evaluated prior to induction Oxygen Delivery Method: Circle system utilized Preoxygenation: Pre-oxygenation with 100% oxygen Induction Type: IV induction Ventilation: Mask ventilation without difficulty Laryngoscope Size: Mac and 3 Grade View: Grade I Tube type: Oral Tube size: 7.0 mm Number of attempts: 1 Airway Equipment and Method: Stylet Placement Confirmation: ETT inserted through vocal cords under direct vision, positive ETCO2, CO2 detector and breath sounds checked- equal and bilateral Secured at: 22 cm Tube secured with: Tape Dental Injury: Teeth and Oropharynx as per pre-operative assessment

## 2023-11-01 ENCOUNTER — Encounter (HOSPITAL_COMMUNITY): Payer: Self-pay | Admitting: General Surgery

## 2023-11-07 DIAGNOSIS — Z20822 Contact with and (suspected) exposure to covid-19: Secondary | ICD-10-CM | POA: Diagnosis not present

## 2023-11-07 DIAGNOSIS — J40 Bronchitis, not specified as acute or chronic: Secondary | ICD-10-CM | POA: Diagnosis not present

## 2023-11-13 ENCOUNTER — Encounter: Payer: Self-pay | Admitting: Family Medicine

## 2023-11-13 ENCOUNTER — Ambulatory Visit: Admitting: Family Medicine

## 2023-11-26 ENCOUNTER — Encounter: Payer: Self-pay | Admitting: General Surgery

## 2023-11-26 ENCOUNTER — Ambulatory Visit: Admitting: General Surgery

## 2023-11-26 VITALS — BP 104/72 | HR 73 | Temp 97.9°F | Resp 14 | Ht 65.0 in | Wt 168.0 lb

## 2023-11-26 DIAGNOSIS — R109 Unspecified abdominal pain: Secondary | ICD-10-CM | POA: Insufficient documentation

## 2023-11-26 DIAGNOSIS — R319 Hematuria, unspecified: Secondary | ICD-10-CM | POA: Diagnosis not present

## 2023-11-26 DIAGNOSIS — K429 Umbilical hernia without obstruction or gangrene: Secondary | ICD-10-CM | POA: Diagnosis not present

## 2023-11-26 MED ORDER — TAMSULOSIN HCL 0.4 MG PO CAPS: 0.4000 mg | ORAL_CAPSULE | Freq: Every day | ORAL | 0 refills | Status: AC

## 2023-11-26 MED ORDER — TRAMADOL HCL 50 MG PO TABS: 50.0000 mg | ORAL_TABLET | Freq: Four times a day (QID) | ORAL | 0 refills | Status: DC | PRN

## 2023-11-26 NOTE — Addendum Note (Signed)
 Addended by: KALLIE SHAVER on: 11/26/2023 09:59 AM   Modules accepted: Orders

## 2023-11-26 NOTE — Patient Instructions (Addendum)
 Will urine sample and try to get CT stone protocol to see about your pain and suspicion of kidney stone.  Will call you with the results  Take the Tamsulosin  which can help you pass the stone.  Tramadol  for pain as needed.

## 2023-11-26 NOTE — Progress Notes (Addendum)
 [facility]  Doing fair. Having some lower abdominal pain and flank pain on the left side for a few days. Blood in urine. Thinks she is having a kidney stone again.   BP 104/72   Pulse 73   Temp 97.9 F (36.6 C) (Oral)   Resp 14   Ht 5' 5 (1.651 m)   Wt 168 lb (76.2 kg)   SpO2 96%   BMI 27.96 kg/m  Soft, nondistended, umbilical hernia site, healed, indurated over the hernia site, no obvious recurrence  Patient s/p umbilical hernia repair. Now with hematuria and pain in abdomen and flank. Will get UA and CT to see if she has a stone given her continued symptoms.   Will call wit results and plan Added tramadol  for pain Tamsulosin  to help the stone pass.   Vickie Pander, MD [facility] [address] [place], [place] [address] [phone] (office)

## 2023-11-27 ENCOUNTER — Ambulatory Visit: Payer: Self-pay | Admitting: General Surgery

## 2023-11-27 LAB — URINALYSIS, ROUTINE W REFLEX MICROSCOPIC
Bilirubin, UA: NEGATIVE
Glucose, UA: NEGATIVE
Ketones, UA: NEGATIVE
Leukocytes,UA: NEGATIVE
Nitrite, UA: NEGATIVE
Protein,UA: NEGATIVE
RBC, UA: NEGATIVE
Specific Gravity, UA: 1.026 (ref 1.005–1.030)
Urobilinogen, Ur: 0.2 mg/dL (ref 0.2–1.0)
pH, UA: 5.5 (ref 5.0–7.5)

## 2023-11-28 ENCOUNTER — Ambulatory Visit (HOSPITAL_BASED_OUTPATIENT_CLINIC_OR_DEPARTMENT_OTHER): Admission: RE | Admit: 2023-11-28 | Source: Ambulatory Visit

## 2024-01-13 ENCOUNTER — Encounter: Payer: Self-pay | Admitting: Adult Health

## 2024-01-13 ENCOUNTER — Ambulatory Visit: Admitting: Adult Health

## 2024-01-13 ENCOUNTER — Other Ambulatory Visit (HOSPITAL_COMMUNITY)
Admission: RE | Admit: 2024-01-13 | Discharge: 2024-01-13 | Disposition: A | Discharge status: Home / Self Care | Source: Ambulatory Visit | Attending: Adult Health | Admitting: Adult Health

## 2024-01-13 VITALS — BP 122/81 | HR 81 | Ht 65.0 in | Wt 170.0 lb

## 2024-01-13 DIAGNOSIS — N898 Other specified noninflammatory disorders of vagina: Secondary | ICD-10-CM | POA: Insufficient documentation

## 2024-01-13 DIAGNOSIS — Z124 Encounter for screening for malignant neoplasm of cervix: Secondary | ICD-10-CM

## 2024-01-13 DIAGNOSIS — N888 Other specified noninflammatory disorders of cervix uteri: Secondary | ICD-10-CM | POA: Diagnosis not present

## 2024-01-13 DIAGNOSIS — R102 Pelvic and perineal pain unspecified side: Secondary | ICD-10-CM

## 2024-01-13 DIAGNOSIS — B9689 Other specified bacterial agents as the cause of diseases classified elsewhere: Secondary | ICD-10-CM

## 2024-01-13 DIAGNOSIS — Z30431 Encounter for routine checking of intrauterine contraceptive device: Secondary | ICD-10-CM

## 2024-01-13 DIAGNOSIS — N760 Acute vaginitis: Secondary | ICD-10-CM | POA: Diagnosis not present

## 2024-01-13 LAB — POCT WET PREP (WET MOUNT)
Clue Cells Wet Prep Whiff POC: POSITIVE
WBC, Wet Prep HPF POC: POSITIVE

## 2024-01-13 MED ORDER — METRONIDAZOLE 500 MG PO TABS: 500.0000 mg | ORAL_TABLET | Freq: Two times a day (BID) | ORAL | 0 refills | Status: DC

## 2024-01-13 NOTE — Progress Notes (Signed)
  Subjective:     Patient ID: Levon DELENA Mealing, female   DOB: 1990-05-18, 33 y.o.   MRN: [id number]  HPI Margit is a 33 year old white female, divorced, G3P3003 in complaining of pelvic pain and no period til about 2 weeks ago, has IUD. Has also had spotting  with sex. She needs a pap too.  PCP is KANDICE Kins NP  Review of Systems +pelvic pain and no period til about 2 weeks ago, has IUD +spotting  with sex. Reviewed past medical,surgical, social and family history. Reviewed medications and allergies.     Objective:   Physical Exam BP 122/81 (BP Location: Right Arm, Patient Position: Sitting, Cuff Size: Normal)   Pulse 81   Ht 5' 5 (1.651 m)   Wt 170 lb (77.1 kg)   BMI 28.29 kg/m     Skin warm and dry.Pelvic: external genitalia is normal in appearance no lesions, vagina: creamy discharge with odor,urethra has no lesions or masses noted, cervix: +IUD strings at os, pap with HR HPV genotyping and GC/CHL performed, cervix friable, with EC brush, uterus: normal size, shape and contour, non tender, no masses felt, adnexa: no masses or tenderness noted. Bladder is non tender and no masses felt. Wet prep: + for clue cells and +WBCs Fall risk is low  Upstream - 01/13/24 1502       Pregnancy Intention Screening   Does the patient want to become pregnant in the next year? No    Does the patient's partner want to become pregnant in the next year? No    Would the patient like to discuss contraceptive options today? No      Contraception Wrap Up   Current Method IUD or IUS    End Method IUD or IUS    Contraception Counseling Provided No         Examination chaperoned by Tish RN   Assessment:     1. Routine cervical smear Pap sent  Pap in 3 years if negative  - Cytology - PAP  2. Vaginal discharge  - POCT Wet Prep [facility]  3. Friable cervix Will rx flagyl and no sex for now   4. BV (bacterial vaginosis) +clue cells on wet prep Will rx flagyl, no sex or alcohol while  taking Meds ordered this encounter  Medications   metroNIDAZOLE (FLAGYL) 500 MG tablet    Sig: Take 1 tablet (500 mg total) by mouth 2 (two) times daily.    Dispense:  14 tablet    Refill:  0    Supervising Provider:   JAYNE MINDER H [[id number]]    - POCT Wet Prep [facility])  5. IUD check up +strings at os   6. Pelvic pain (Primary) +pelvic pain Will get pelvic US  in office to assess uterus and ovaries  - US  PELVIC COMPLETE WITH TRANSVAGINAL; Future     Plan:     Return for pelvic US  in office in 2 weeks

## 2024-01-16 ENCOUNTER — Ambulatory Visit: Payer: Self-pay | Admitting: Adult Health

## 2024-01-16 LAB — CYTOLOGY - PAP
Chlamydia: NEGATIVE
Comment: NEGATIVE
Comment: NEGATIVE
Comment: NORMAL
Diagnosis: NEGATIVE
High risk HPV: NEGATIVE
Neisseria Gonorrhea: NEGATIVE

## 2024-01-28 ENCOUNTER — Ambulatory Visit: Admitting: Radiology

## 2024-01-28 DIAGNOSIS — T8332XA Displacement of intrauterine contraceptive device, initial encounter: Secondary | ICD-10-CM | POA: Diagnosis not present

## 2024-01-28 DIAGNOSIS — R102 Pelvic and perineal pain unspecified side: Secondary | ICD-10-CM

## 2024-01-28 NOTE — Progress Notes (Signed)
 GYN US : TA and TV imaging performed - vinyl probe cover used - Chaperone:  Anteverted uterus normal in size, symmetrical, homogeneous myometrium, no focal abn seen in myometrium. Endom thickness = 2.7 mm, uniform cavity and canal, no evidence of soft tissue intracavitary defects.  The IUD is displaced down into upper cervical canal. Ovaries appear normal in size,  The ovaries appear mobile, normal blood flow to ovaries,neg adnexal regions,  neg CDS, no free fluid present

## 2024-02-02 ENCOUNTER — Encounter: Payer: Self-pay | Admitting: Adult Health

## 2024-02-02 ENCOUNTER — Ambulatory Visit: Admitting: Adult Health

## 2024-02-02 VITALS — BP 96/68 | HR 87 | Ht 65.0 in | Wt 171.5 lb

## 2024-02-02 DIAGNOSIS — Z3202 Encounter for pregnancy test, result negative: Secondary | ICD-10-CM | POA: Diagnosis not present

## 2024-02-02 DIAGNOSIS — T8332XA Displacement of intrauterine contraceptive device, initial encounter: Secondary | ICD-10-CM | POA: Diagnosis not present

## 2024-02-02 DIAGNOSIS — Z30433 Encounter for removal and reinsertion of intrauterine contraceptive device: Secondary | ICD-10-CM | POA: Diagnosis not present

## 2024-02-02 DIAGNOSIS — T8332XS Displacement of intrauterine contraceptive device, sequela: Secondary | ICD-10-CM | POA: Insufficient documentation

## 2024-02-02 LAB — POCT URINE PREGNANCY: Preg Test, Ur: NEGATIVE

## 2024-02-02 MED ORDER — LEVONORGESTREL 20 MCG/DAY IU IUD
1.0000 | INTRAUTERINE_SYSTEM | Freq: Once | INTRAUTERINE | Status: AC
  Administered 2024-02-02: 1 via INTRAUTERINE

## 2024-02-02 NOTE — Addendum Note (Signed)
 Addended by: NEYSA CLARITA RAMAN on: 02/02/2024 10:40 AM   Modules accepted: Orders

## 2024-02-02 NOTE — Progress Notes (Signed)
 Patient ID: Levon DELENA Mealing, female   DOB: 1990-05-14, 33 y.o.   MRN: [id number]   IUD INSERTION Patient name: GENISE STRACK MRN [id number]  Date of birth: [birth year] Subjective Findings:   VALISHA HESLIN is a 33 y.o. G51P3003 Caucasian female being seen today for removal and  reinsertion of a Mirena  IUD. IUD was malpositioned as per US . Patient's last menstrual period was 01/05/2024 (approximate). Last sexual intercourse was not obtained. Last pap 01/13/24. Results were: NILM w/ HRHPV negative  The risks and benefits of the method and placement have been thouroughly reviewed with the patient and all questions were answered.  Specifically the patient is aware of failure rate of 03/998, expulsion of the IUD and of possible perforation.  The patient is aware of irregular bleeding due to the method and understands the incidence of irregular bleeding diminishes with time.  Signed copy of informed consent in chart.      10/09/2023    4:07 PM 08/13/2023    8:48 AM 05/13/2023    3:07 PM 01/01/2023   10:55 AM 07/12/2020   10:17 AM  Depression screen PHQ 2/9  Decreased Interest 0 0 0 3 0  Down, Depressed, Hopeless 0 0 0 3 0  PHQ - 2 Score 0 0 0 6 0  Altered sleeping   1 3 0  Tired, decreased energy   1 3 0  Change in appetite   0 3 0  Feeling bad or failure about yourself    0 3 0  Trouble concentrating   0 2 0  Moving slowly or fidgety/restless   0 2 0  Suicidal thoughts   0 0 0  PHQ-9 Score   2  22  0   Difficult doing work/chores    Very difficult      Data saved with a previous flowsheet row definition        08/13/2023    8:48 AM 05/13/2023    3:08 PM 01/01/2023   10:55 AM 07/12/2020   10:17 AM  GAD 7 : Generalized Anxiety Score  Nervous, Anxious, on Edge 0 0 3 0  Control/stop worrying 0 0 3 0  Worry too much - different things 0 0 3 0  Trouble relaxing 0 0 3 0  Restless 0 0 1 0  Easily annoyed or irritable 0 0 3 0  Afraid - awful might happen 0 0 3 0  Total GAD 7 Score 0 0 19 0   Anxiety Difficulty Not difficult at all Not difficult at all Very difficult      Pertinent History Reviewed:   Reviewed past medical,surgical, social, obstetrical and family history.  Reviewed problem list, medications and allergies. Objective Findings & Procedure:   Vitals:   02/02/24 0945  BP: 96/68  Pulse: 87  Weight: 171 lb 8 oz (77.8 kg)  Height: 5' 5 (1.651 m)  Body mass index is 28.54 kg/m.  Results for orders placed or performed in visit on 02/02/24 (from the past 24 hours)  POCT urine pregnancy   Collection Time: 02/02/24  9:44 AM  Result Value Ref Range   Preg Test, Ur Negative Negative     Time out was performed.  A large Pedersen speculum was placed in the vagina. IUD strings at os, grasped with forceps and asked to cough and IUD easily removed. The cervix was visualized, prepped using Betadine. The uterus was found to be anteroflexed and it sounded to 7 cm.  Mirena   IUD placed per  manufacturer's recommendations. The strings were trimmed to approximately 4 cm. The patient tolerated the procedure well.   Informal abdominal sonogram was performed and the proper placement of the IUD was verified by me and [facility].  Chaperone: Clarita Salt Assessment & Plan:   1) Mirena  IUD  removal  and reinsertion The patient was given post procedure instructions, including signs and symptoms of infection and to check for the strings after each menses or each month, and refraining from intercourse or anything in the vagina for 3 days. She was given a care card with date IUD placed, and date IUD to be removed. She is scheduled for a f/u appointment in 4 weeks. 2. Malpositioned IUD, removed  Orders Placed This Encounter  Procedures   POCT urine pregnancy    Return in about 4 weeks (around 03/01/2024) for IUD check .  Delon Lewis NP 02/02/2024 10:20 AM

## 2024-03-01 ENCOUNTER — Ambulatory Visit: Admitting: Adult Health
